# Patient Record
Sex: Male | Born: 1990 | Race: White | Hispanic: No | Smoking: Current every day smoker
Health system: Southern US, Community
[De-identification: ages and names within clinical notes are randomized; demographics above are authoritative.]

## PROBLEM LIST (undated history)

## (undated) DIAGNOSIS — F419 Anxiety disorder, unspecified: Secondary | ICD-10-CM

## (undated) DIAGNOSIS — F32A Depression, unspecified: Secondary | ICD-10-CM

## (undated) DIAGNOSIS — F329 Major depressive disorder, single episode, unspecified: Secondary | ICD-10-CM

## (undated) HISTORY — PX: WRIST SURGERY: SHX841

---
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
## 2010-09-27 ENCOUNTER — Emergency Department: Payer: Self-pay | Admitting: Emergency Medicine

## 2019-08-27 ENCOUNTER — Emergency Department
Admission: EM | Admit: 2019-08-27 | Discharge: 2019-08-27 | Disposition: A | Discharge status: Home / Self Care | Payer: BLUE CROSS/BLUE SHIELD | Attending: Emergency Medicine | Admitting: Emergency Medicine

## 2019-08-27 ENCOUNTER — Other Ambulatory Visit: Payer: Self-pay

## 2019-08-27 ENCOUNTER — Encounter: Payer: Self-pay | Admitting: Emergency Medicine

## 2019-08-27 DIAGNOSIS — Z79899 Other long term (current) drug therapy: Secondary | ICD-10-CM | POA: Insufficient documentation

## 2019-08-27 DIAGNOSIS — F1123 Opioid dependence with withdrawal: Secondary | ICD-10-CM | POA: Diagnosis not present

## 2019-08-27 DIAGNOSIS — R5383 Other fatigue: Secondary | ICD-10-CM | POA: Diagnosis present

## 2019-08-27 DIAGNOSIS — F17200 Nicotine dependence, unspecified, uncomplicated: Secondary | ICD-10-CM | POA: Insufficient documentation

## 2019-08-27 DIAGNOSIS — F1320 Sedative, hypnotic or anxiolytic dependence, uncomplicated: Secondary | ICD-10-CM | POA: Insufficient documentation

## 2019-08-27 HISTORY — DX: Anxiety disorder, unspecified: F41.9

## 2019-08-27 HISTORY — DX: Depression, unspecified: F32.A

## 2019-08-27 LAB — CBC WITH DIFFERENTIAL/PLATELET
Abs Immature Granulocytes: 0.14 10*3/uL — ABNORMAL HIGH (ref 0.00–0.07)
Basophils Absolute: 0 10*3/uL (ref 0.0–0.1)
Basophils Relative: 0 %
Eosinophils Absolute: 0 10*3/uL (ref 0.0–0.5)
Eosinophils Relative: 0 %
HCT: 43.6 % (ref 39.0–52.0)
Hemoglobin: 15 g/dL (ref 13.0–17.0)
Immature Granulocytes: 2 %
Lymphocytes Relative: 11 %
Lymphs Abs: 1 10*3/uL (ref 0.7–4.0)
MCH: 28.8 pg (ref 26.0–34.0)
MCHC: 34.4 g/dL (ref 30.0–36.0)
MCV: 83.7 fL (ref 80.0–100.0)
Monocytes Absolute: 0.4 10*3/uL (ref 0.1–1.0)
Monocytes Relative: 5 %
Neutro Abs: 7.2 10*3/uL (ref 1.7–7.7)
Neutrophils Relative %: 82 %
Platelets: 248 10*3/uL (ref 150–400)
RBC: 5.21 MIL/uL (ref 4.22–5.81)
RDW: 11.8 % (ref 11.5–15.5)
WBC: 8.8 10*3/uL (ref 4.0–10.5)
nRBC: 0 % (ref 0.0–0.2)

## 2019-08-27 LAB — URINE DRUG SCREEN, QUALITATIVE (ARMC ONLY)
Amphetamines, Ur Screen: NOT DETECTED
Barbiturates, Ur Screen: NOT DETECTED
Benzodiazepine, Ur Scrn: POSITIVE — AB
Cannabinoid 50 Ng, Ur ~~LOC~~: POSITIVE — AB
Cocaine Metabolite,Ur ~~LOC~~: NOT DETECTED
MDMA (Ecstasy)Ur Screen: NOT DETECTED
Methadone Scn, Ur: NOT DETECTED
Opiate, Ur Screen: NOT DETECTED
Phencyclidine (PCP) Ur S: NOT DETECTED
Tricyclic, Ur Screen: NOT DETECTED

## 2019-08-27 LAB — COMPREHENSIVE METABOLIC PANEL
ALT: 25 U/L (ref 0–44)
AST: 26 U/L (ref 15–41)
Albumin: 4.9 g/dL (ref 3.5–5.0)
Alkaline Phosphatase: 50 U/L (ref 38–126)
Anion gap: 14 (ref 5–15)
BUN: 9 mg/dL (ref 6–20)
CO2: 22 mmol/L (ref 22–32)
Calcium: 9.6 mg/dL (ref 8.9–10.3)
Chloride: 103 mmol/L (ref 98–111)
Creatinine, Ser: 0.75 mg/dL (ref 0.61–1.24)
GFR calc Af Amer: >60 mL/min (ref >60)
GFR calc non Af Amer: >60 mL/min (ref >60)
Glucose, Bld: 142 mg/dL — ABNORMAL HIGH (ref 70–99)
Potassium: 4.1 mmol/L (ref 3.5–5.1)
Sodium: 139 mmol/L (ref 135–145)
Total Bilirubin: 0.6 mg/dL (ref 0.3–1.2)
Total Protein: 7.9 g/dL (ref 6.5–8.1)

## 2019-08-27 LAB — BLOOD GAS, VENOUS
Acid-base deficit: 0.4 mmol/L (ref 0.0–2.0)
Bicarbonate: 25.4 mmol/L (ref 20.0–28.0)
O2 Saturation: 82.5 %
Patient temperature: 37
pCO2, Ven: 45 mmHg (ref 44.0–60.0)
pH, Ven: 7.36 (ref 7.250–7.430)
pO2, Ven: 49 mmHg — ABNORMAL HIGH (ref 32.0–45.0)

## 2019-08-27 LAB — ACETAMINOPHEN LEVEL: Acetaminophen (Tylenol), Serum: <10 ug/mL — ABNORMAL LOW (ref 10–30)

## 2019-08-27 LAB — ETHANOL: Alcohol, Ethyl (B): <10 mg/dL (ref <10)

## 2019-08-27 LAB — SALICYLATE LEVEL: Salicylate Lvl: <7 mg/dL (ref 2.8–30.0)

## 2019-08-27 MED ORDER — LORAZEPAM 2 MG PO TABS: 0.0000 mg | ORAL_TABLET | Freq: Two times a day (BID) | ORAL | Status: DC

## 2019-08-27 MED ORDER — ONDANSETRON 4 MG PO TBDP: 4.0000 mg | ORAL_TABLET | Freq: Three times a day (TID) | ORAL | 0 refills | Status: DC | PRN

## 2019-08-27 MED ORDER — ONDANSETRON HCL 4 MG PO TABS: 4.0000 mg | ORAL_TABLET | Freq: Three times a day (TID) | ORAL | Status: DC | PRN

## 2019-08-27 MED ORDER — CHLORDIAZEPOXIDE HCL 25 MG PO CAPS: ORAL_CAPSULE | ORAL | 0 refills | Status: DC

## 2019-08-27 MED ORDER — ALUM & MAG HYDROXIDE-SIMETH 200-200-20 MG/5ML PO SUSP: 30.0000 mL | Freq: Four times a day (QID) | ORAL | Status: DC | PRN

## 2019-08-27 MED ORDER — VITAMIN B-1 100 MG PO TABS
100.0000 mg | ORAL_TABLET | Freq: Every day | ORAL | Status: DC
  Administered 2019-08-27: 100 mg via ORAL
  Filled 2019-08-27: qty 1

## 2019-08-27 MED ORDER — CHLORDIAZEPOXIDE HCL 25 MG PO CAPS: ORAL_CAPSULE | ORAL | 0 refills | Status: AC

## 2019-08-27 MED ORDER — LORAZEPAM 2 MG/ML IJ SOLN: 0.0000 mg | Freq: Four times a day (QID) | INTRAMUSCULAR | Status: DC

## 2019-08-27 MED ORDER — LORAZEPAM 2 MG/ML IJ SOLN: 0.0000 mg | Freq: Two times a day (BID) | INTRAMUSCULAR | Status: DC

## 2019-08-27 MED ORDER — THIAMINE HCL 100 MG/ML IJ SOLN: 100.0000 mg | Freq: Every day | INTRAMUSCULAR | Status: DC

## 2019-08-27 MED ORDER — LORAZEPAM 2 MG/ML IJ SOLN: 1.0000 mg | Freq: Once | INTRAMUSCULAR | Status: DC

## 2019-08-27 MED ORDER — LORAZEPAM 2 MG PO TABS: 0.0000 mg | ORAL_TABLET | Freq: Four times a day (QID) | ORAL | Status: DC

## 2019-08-27 MED ORDER — LORAZEPAM 2 MG PO TABS
2.0000 mg | ORAL_TABLET | Freq: Once | ORAL | Status: AC
  Administered 2019-08-27: 21:00:00 2 mg via ORAL
  Filled 2019-08-27: qty 1

## 2019-08-27 NOTE — ED Notes (Signed)
Respiratory called and informed about blood being sent to lab.

## 2019-08-27 NOTE — ED Provider Notes (Signed)
[facility] Emergency Department Provider Note  ____________________________________________   First MD Initiated Contact with Patient 08/27/19 1823     (approximate)  I have reviewed the triage vital signs and the nursing notes.   HISTORY  Chief Complaint Fatigue, Anxiety, and Chest Pain    HPI Louis Pitts is a 28 y.o. male  Here seeking help for addiction, depression. Pt is on long-term suboxone and xanax prescriptions. States he takes 3 mg of Xanax TID.  He states that he recently moved to [place] from [place].  His pain physician is in [place].  He states that he has not been able to get back because he said multiple recent stressors including the recent death of his sister.  He is also had difficulty with transport.  He states that he has developed subsequent worsening depression.  He had been taking more Xanax than prescribed due to increasing anxiety feels like he can no longer function.  He also ran out of Suboxone.  He tried calling for help but his physician would not call it in, so he is here for help.  He states he is felt shaky, more depressed, anxious since running out of his medications.  He said chills.  He said chest pain which he states is also common for him.  Denies active SI but has had SI in the past. No HI. No AVH.       Past Medical History:  Diagnosis Date  . Anxiety   . Depression     There are no active problems to display for this patient.   History reviewed. No pertinent surgical history.  Prior to Admission medications   Medication Sig Start Date End Date Taking? Authorizing Provider  chlordiazePOXIDE (LIBRIUM) 25 MG capsule Take 2 capsules (50 mg total) by mouth 4 (four) times daily for 1 day, THEN 1 capsule (25 mg total) 4 (four) times daily for 1 day, THEN 1 capsule (25 mg total) 2 (two) times daily for 1 day, THEN 1 capsule (25 mg total) at bedtime for 1 day. 08/27/19 08/31/19  Shaune PollackIsaacs, Jackie Littlejohn, MD  ondansetron  (ZOFRAN ODT) 4 MG disintegrating tablet Take 1 tablet (4 mg total) by mouth every 8 (eight) hours as needed for nausea or vomiting. 08/27/19   Shaune PollackIsaacs, Rennae Ferraiolo, MD    Allergies Patient has no known allergies.  No family history on file.  Social History Social History   Tobacco Use  . Smoking status: Current Some Day Smoker  . Smokeless tobacco: Never Used  Substance Use Topics  . Alcohol use: Not Currently  . Drug use: Not Currently    Review of Systems  Review of Systems  Constitutional: Positive for chills and fatigue. Negative for fever.  HENT: Negative for sore throat.   Respiratory: Negative for shortness of breath.   Cardiovascular: Negative for chest pain.  Gastrointestinal: Positive for nausea. Negative for abdominal pain.  Genitourinary: Negative for flank pain.  Musculoskeletal: Negative for neck pain.  Skin: Negative for rash and wound.  Allergic/Immunologic: Negative for immunocompromised state.  Neurological: Positive for seizures and weakness. Negative for numbness.  Hematological: Does not bruise/bleed easily.  All other systems reviewed and are negative.    ____________________________________________  PHYSICAL EXAM:      VITAL SIGNS: ED Triage Vitals  Enc Vitals Group     BP      Pulse      Resp      Temp      Temp src  SpO2      Weight      Height      Head Circumference      Peak Flow      Pain Score      Pain Loc      Pain Edu?      Excl. in [place]?      Physical Exam Vitals signs and nursing note reviewed.  Constitutional:      General: He is not in acute distress.    Appearance: He is well-developed.  HENT:     Head: Normocephalic and atraumatic.  Eyes:     Conjunctiva/sclera: Conjunctivae normal.     Pupils: Pupils are equal, round, and reactive to light.     Comments: Dilated pupils bl  Neck:     Musculoskeletal: Neck supple.  Cardiovascular:     Rate and Rhythm: Normal rate and regular rhythm.     Heart sounds: Normal  heart sounds. No murmur. No friction rub.  Pulmonary:     Effort: Pulmonary effort is normal. No respiratory distress.     Breath sounds: Normal breath sounds. No wheezing or rales.  Abdominal:     General: There is no distension.     Palpations: Abdomen is soft.     Tenderness: There is no abdominal tenderness.  Skin:    General: Skin is warm.     Capillary Refill: Capillary refill takes less than 2 seconds.  Neurological:     Mental Status: He is alert and oriented to person, place, and time.     Motor: No abnormal muscle tone.      Neurological Exam:  Mental Status: Alert and oriented to person, place, and time. Attention and concentration normal. Speech clear. Recent memory is intact. Cranial Nerves: Visual fields grossly intact. EOMI and PERRLA. No nystagmus noted. Facial sensation intact at forehead, maxillary cheek, and chin/mandible bilaterally. No facial asymmetry or weakness. Hearing grossly normal. Uvula is midline, and palate elevates symmetrically. Normal SCM and trapezius strength. Tongue midline without fasciculations. Motor: Muscle strength 5/5 in proximal and distal UE and LE bilaterally. No pronator drift. Muscle tone normal. Reflexes: 2+ and symmetrical in all four extremities.  Sensation: Intact to light touch in upper and lower extremities distally bilaterally.  Gait: Normal without ataxia. Coordination: Normal FTN bilaterally.    ____________________________________________   LABS (all labs ordered are listed, but only abnormal results are displayed)  Labs Reviewed  CBC WITH DIFFERENTIAL/PLATELET - Abnormal; Notable for the following components:      Result Value   Abs Immature Granulocytes 0.14 (*)    All other components within normal limits  COMPREHENSIVE METABOLIC PANEL - Abnormal; Notable for the following components:   Glucose, Bld 142 (*)    All other components within normal limits  URINE DRUG SCREEN, QUALITATIVE ([facility] ONLY) - Abnormal; Notable  for the following components:   Cannabinoid 50 Ng, Ur [place] POSITIVE (*)    Benzodiazepine, Ur Scrn POSITIVE (*)    All other components within normal limits  BLOOD GAS, VENOUS - Abnormal; Notable for the following components:   pO2, Ven 49.0 (*)    All other components within normal limits  ACETAMINOPHEN LEVEL - Abnormal; Notable for the following components:   Acetaminophen (Tylenol), Serum <10 (*)    All other components within normal limits  ETHANOL  SALICYLATE LEVEL    ____________________________________________  EKG: Normal sinus rhythm, VR 96. PR 144, QRS 86, QTc 462. No ST-t segment elevation or depressions. No signs of  acute ischemia or infarct. No arrhythmia. ________________________________________  RADIOLOGY All imaging, including plain films, CT scans, and ultrasounds, independently reviewed by me, and interpretations confirmed via formal radiology reads.  ED MD interpretation:   None  Official radiology report(s): No results found.  ____________________________________________  PROCEDURES   Procedure(s) performed (including Critical Care):  Procedures  ____________________________________________  INITIAL IMPRESSION / MDM / ASSESSMENT AND PLAN / ED COURSE  As part of my medical decision making, I reviewed the following data within the electronic MEDICAL RECORD NUMBER Notes from prior ED visits and [place] Controlled Substance Database      *LAMEL HAYENGA was evaluated in Emergency Department on 08/28/2019 for the symptoms described in the history of present illness. He was evaluated in the context of the global COVID-19 pandemic, which necessitated consideration that the patient might be at risk for infection with the SARS-CoV-2 virus that causes COVID-19. Institutional protocols and algorithms that pertain to the evaluation of patients at risk for COVID-19 are in a state of rapid change based on information released by regulatory bodies including the CDC and  federal and state organizations. These policies and algorithms were followed during the patient's care in the ED.  Some ED evaluations and interventions may be delayed as a result of limited staffing during the pandemic.*      Medical Decision Making: 28 yo M with h/o anxiety, depression, here with shaking, sweatiness, anxiety, SOB in setting of running out of his Xanax and Suboxone. Pt has long h/o benzo use and opiate dependence. He is on extremely high dose of xanax 3mg  TID, in addition to suboxone. Reviewed his drug database - he does seem to be getting regular scripts from his PCP in [place] but is now in [place], where he is from. [facility]ed [facility] and [place] [facility]. He is calm, appropriate here. He does have reported possible seizure though this sounds more like tremors and hsi neuro exam is non-focal. This has all occurred to him when trying to stop benzos in the past. No signs of organic etiology.  Pt is goal oriented, denies any SI, HI, AVH, and has arranged fro outpt follow-up at [facility]. He is here with his family, who is at bedside and is going to watch pt at home until he checks in for inpt care.   While he does have high risk based on hsitory, feel the risks of untreated benzo w/d outweight risks of librium taper. I discussed that a brief course of librium could be rx'ed until he can go to [facility] in the next few days, but that it would need to remain with his parents for dosing as appropriate and prescribed. Family in agreement. They will monitor pt and distribute meds. He otherwise does not desire inpt detox at this time and does not meet inpt criteria. Short course of librium rx. Risks of accidental o/d discussed in detail. Has narcan @ home. He is out of his suboxone as well but declines tx for opiate w/d, states he has gone through w/d in past and will "be okay."  ____________________________________________  FINAL CLINICAL IMPRESSION(S) / ED DIAGNOSES  Final diagnoses:  Benzodiazepine  dependence (HCC)  Opioid dependence with withdrawal (HCC)     MEDICATIONS GIVEN DURING THIS VISIT:  Medications  LORazepam (ATIVAN) tablet 2 mg (2 mg Oral Given 08/27/19 2042)     ED Discharge Orders         Ordered    chlordiazePOXIDE (LIBRIUM) 25 MG capsule  Status:  Discontinued  08/27/19 2040    ondansetron (ZOFRAN ODT) 4 MG disintegrating tablet  Every 8 hours PRN,   Status:  Discontinued     08/27/19 2040    chlordiazePOXIDE (LIBRIUM) 25 MG capsule     08/27/19 2135    ondansetron (ZOFRAN ODT) 4 MG disintegrating tablet  Every 8 hours PRN     08/27/19 2135           Note:  This document was prepared using Dragon voice recognition software and may include unintentional dictation errors.   Shaune PollackIsaacs, Jestine Bicknell, MD 08/28/19 1025

## 2019-08-27 NOTE — ED Notes (Signed)
Care assumed at this time no report/handoff received

## 2019-08-27 NOTE — ED Notes (Addendum)
Pt with father at bedside poor historian with timeline of current and past problems and treatment nonconcurrent, reports suboxone use for the last 2 and intermittent abuse of benzodiazepines; pt's father reports pt is scheduled to go to treatment facility in [place] for detox  Pt reports wanting help for anxiety "this happens when I run out of my Xanax", pt reports taking my than prescribed d/t stress over "my sister just died", father reports EMS called because pt had a seizure "he was foaming at the mouth and twitching"  Pt has IV in right [facility]

## 2019-08-27 NOTE — ED Notes (Signed)
Peripheral IV discontinued. Catheter intact. No signs of infiltration or redness. Gauze applied to IV site.   

## 2019-08-27 NOTE — ED Notes (Signed)
This RN has gotten several calls for the pharmacy that pt prefered is closed now pharmacy changed and EDP sent to another pharmacy as of 2136

## 2019-08-27 NOTE — ED Triage Notes (Signed)
Pt to ED via [facility] for suspected xanax overdose. Per EMS they were called out because pt was more lethargic than normal, ems reports pt had some dried blood on his nose. Vital signs per EMS were as follows Heart rate 110, SpO2 98% on room air, Blood pressure 128/78.   Upon arrival to ED pt is alert and oriented. Pt states that he has hx/o anxiety and depression. Pt is prescribed Xanax 1 mg TID. Pt denies that he has taken more than his prescribed dose. Pt denies SI/HI at this time. Pt is calm and cooperative. Pt is currently reporting 3/10 non-radiating chest pain that he describes as "anxiety". Pt denies any shortness of breath at this time. Pt reports that his sister died from cancer approximately 8 months ago, pt states that he is having a difficult time dealing with this because his family does not really talk about it.   Pt is in no distress at this time.

## 2019-11-16 ENCOUNTER — Emergency Department
Admission: EM | Admit: 2019-11-16 | Discharge: 2019-11-16 | Disposition: A | Discharge status: Home / Self Care | Payer: BLUE CROSS/BLUE SHIELD | Attending: Emergency Medicine | Admitting: Emergency Medicine

## 2019-11-16 ENCOUNTER — Emergency Department: Payer: BLUE CROSS/BLUE SHIELD

## 2019-11-16 ENCOUNTER — Other Ambulatory Visit: Payer: Self-pay

## 2019-11-16 DIAGNOSIS — T50901A Poisoning by unspecified drugs, medicaments and biological substances, accidental (unintentional), initial encounter: Secondary | ICD-10-CM

## 2019-11-16 DIAGNOSIS — F17200 Nicotine dependence, unspecified, uncomplicated: Secondary | ICD-10-CM | POA: Diagnosis not present

## 2019-11-16 DIAGNOSIS — F1910 Other psychoactive substance abuse, uncomplicated: Secondary | ICD-10-CM | POA: Insufficient documentation

## 2019-11-16 LAB — CBC WITH DIFFERENTIAL/PLATELET
Abs Immature Granulocytes: 0.02 10*3/uL (ref 0.00–0.07)
Basophils Absolute: 0 10*3/uL (ref 0.0–0.1)
Basophils Relative: 0 %
Eosinophils Absolute: 0.1 10*3/uL (ref 0.0–0.5)
Eosinophils Relative: 3 %
HCT: 42.6 % (ref 39.0–52.0)
Hemoglobin: 14.1 g/dL (ref 13.0–17.0)
Immature Granulocytes: 0 %
Lymphocytes Relative: 27 %
Lymphs Abs: 1.2 10*3/uL (ref 0.7–4.0)
MCH: 28.8 pg (ref 26.0–34.0)
MCHC: 33.1 g/dL (ref 30.0–36.0)
MCV: 86.9 fL (ref 80.0–100.0)
Monocytes Absolute: 0.3 10*3/uL (ref 0.1–1.0)
Monocytes Relative: 8 %
Neutro Abs: 2.8 10*3/uL (ref 1.7–7.7)
Neutrophils Relative %: 62 %
Platelets: 210 10*3/uL (ref 150–400)
RBC: 4.9 MIL/uL (ref 4.22–5.81)
RDW: 12.7 % (ref 11.5–15.5)
WBC: 4.5 10*3/uL (ref 4.0–10.5)
nRBC: 0 % (ref 0.0–0.2)

## 2019-11-16 LAB — URINE DRUG SCREEN, QUALITATIVE (ARMC ONLY)
Amphetamines, Ur Screen: NOT DETECTED
Barbiturates, Ur Screen: NOT DETECTED
Benzodiazepine, Ur Scrn: POSITIVE — AB
Cannabinoid 50 Ng, Ur ~~LOC~~: POSITIVE — AB
Cocaine Metabolite,Ur ~~LOC~~: POSITIVE — AB
MDMA (Ecstasy)Ur Screen: NOT DETECTED
Methadone Scn, Ur: NOT DETECTED
Opiate, Ur Screen: POSITIVE — AB
Phencyclidine (PCP) Ur S: NOT DETECTED
Tricyclic, Ur Screen: NOT DETECTED

## 2019-11-16 LAB — COMPREHENSIVE METABOLIC PANEL
ALT: 15 U/L (ref 0–44)
AST: 16 U/L (ref 15–41)
Albumin: 4.4 g/dL (ref 3.5–5.0)
Alkaline Phosphatase: 47 U/L (ref 38–126)
Anion gap: 11 (ref 5–15)
BUN: 13 mg/dL (ref 6–20)
CO2: 22 mmol/L (ref 22–32)
Calcium: 8.8 mg/dL — ABNORMAL LOW (ref 8.9–10.3)
Chloride: 104 mmol/L (ref 98–111)
Creatinine, Ser: 0.72 mg/dL (ref 0.61–1.24)
GFR calc Af Amer: >60 mL/min (ref >60)
GFR calc non Af Amer: >60 mL/min (ref >60)
Glucose, Bld: 208 mg/dL — ABNORMAL HIGH (ref 70–99)
Potassium: 3.7 mmol/L (ref 3.5–5.1)
Sodium: 137 mmol/L (ref 135–145)
Total Bilirubin: 0.7 mg/dL (ref 0.3–1.2)
Total Protein: 7.3 g/dL (ref 6.5–8.1)

## 2019-11-16 LAB — TROPONIN I (HIGH SENSITIVITY): Troponin I (High Sensitivity): <2 ng/L (ref <18)

## 2019-11-16 LAB — ETHANOL: Alcohol, Ethyl (B): <10 mg/dL (ref <10)

## 2019-11-16 MED ORDER — SODIUM CHLORIDE 0.9 % IV SOLN
Freq: Once | INTRAVENOUS | Status: AC
  Administered 2019-11-16: 1000 mL via INTRAVENOUS
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
## 2019-11-16 NOTE — ED Notes (Signed)
Patient transported to CT 
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
## 2022-03-14 ENCOUNTER — Other Ambulatory Visit: Payer: Self-pay

## 2022-03-14 ENCOUNTER — Encounter: Payer: Self-pay | Admitting: Nurse Practitioner

## 2022-03-14 ENCOUNTER — Ambulatory Visit: Payer: BLUE CROSS/BLUE SHIELD | Admitting: Nurse Practitioner

## 2022-03-14 VITALS — BP 108/76 | HR 72 | Temp 97.4°F | Resp 12 | Ht 70.5 in | Wt 224.0 lb

## 2022-03-14 DIAGNOSIS — F5101 Primary insomnia: Secondary | ICD-10-CM

## 2022-03-14 DIAGNOSIS — Z6831 Body mass index (BMI) 31.0-31.9, adult: Secondary | ICD-10-CM

## 2022-03-14 DIAGNOSIS — F419 Anxiety disorder, unspecified: Secondary | ICD-10-CM | POA: Insufficient documentation

## 2022-03-14 DIAGNOSIS — Z0000 Encounter for general adult medical examination without abnormal findings: Secondary | ICD-10-CM | POA: Diagnosis not present

## 2022-03-14 DIAGNOSIS — R0683 Snoring: Secondary | ICD-10-CM

## 2022-03-14 DIAGNOSIS — Z720 Tobacco use: Secondary | ICD-10-CM

## 2022-03-14 DIAGNOSIS — E6609 Other obesity due to excess calories: Secondary | ICD-10-CM

## 2022-03-14 DIAGNOSIS — E66811 Obesity, class 1: Secondary | ICD-10-CM | POA: Insufficient documentation

## 2022-03-14 DIAGNOSIS — Q559 Congenital malformation of male genital organ, unspecified: Secondary | ICD-10-CM

## 2022-03-14 DIAGNOSIS — H6123 Impacted cerumen, bilateral: Secondary | ICD-10-CM

## 2022-03-14 LAB — COMPREHENSIVE METABOLIC PANEL
ALT: 27 U/L (ref 0–53)
AST: 16 U/L (ref 0–37)
Albumin: 4.8 g/dL (ref 3.5–5.2)
Alkaline Phosphatase: 43 U/L (ref 39–117)
BUN: 11 mg/dL (ref 6–23)
CO2: 31 mEq/L (ref 19–32)
Calcium: 9.2 mg/dL (ref 8.4–10.5)
Chloride: 105 mEq/L (ref 96–112)
Creatinine, Ser: 0.93 mg/dL (ref 0.40–1.50)
GFR: 109.11 mL/min (ref >60.00)
Glucose, Bld: 94 mg/dL (ref 70–99)
Potassium: 4.5 mEq/L (ref 3.5–5.1)
Sodium: 140 mEq/L (ref 135–145)
Total Bilirubin: 0.5 mg/dL (ref 0.2–1.2)
Total Protein: 6.7 g/dL (ref 6.0–8.3)

## 2022-03-14 LAB — CBC WITH DIFFERENTIAL/PLATELET
Basophils Absolute: 0 10*3/uL (ref 0.0–0.1)
Basophils Relative: 0.6 % (ref 0.0–3.0)
Eosinophils Absolute: 0.2 10*3/uL (ref 0.0–0.7)
Eosinophils Relative: 4 % (ref 0.0–5.0)
HCT: 45.8 % (ref 39.0–52.0)
Hemoglobin: 15.6 g/dL (ref 13.0–17.0)
Lymphocytes Relative: 35.5 % (ref 12.0–46.0)
Lymphs Abs: 2 10*3/uL (ref 0.7–4.0)
MCHC: 34 g/dL (ref 30.0–36.0)
MCV: 87.3 fl (ref 78.0–100.0)
Monocytes Absolute: 0.5 10*3/uL (ref 0.1–1.0)
Monocytes Relative: 9.6 % (ref 3.0–12.0)
Neutro Abs: 2.9 10*3/uL (ref 1.4–7.7)
Neutrophils Relative %: 50.3 % (ref 43.0–77.0)
Platelets: 216 10*3/uL (ref 150.0–400.0)
RBC: 5.25 Mil/uL (ref 4.22–5.81)
RDW: 13.1 % (ref 11.5–15.5)
WBC: 5.7 10*3/uL (ref 4.0–10.5)

## 2022-03-14 LAB — LIPID PANEL
Cholesterol: 189 mg/dL (ref 0–200)
HDL: 42.7 mg/dL (ref >39.00)
LDL Cholesterol: 129 mg/dL — ABNORMAL HIGH (ref 0–99)
NonHDL: 146.42
Total CHOL/HDL Ratio: 4
Triglycerides: 88 mg/dL (ref 0.0–149.0)
VLDL: 17.6 mg/dL (ref 0.0–40.0)

## 2022-03-14 LAB — HEMOGLOBIN A1C: Hgb A1c MFr Bld: 5.5 % (ref 4.6–6.5)

## 2022-03-14 LAB — TSH: TSH: 0.9 u[IU]/mL (ref 0.35–5.50)

## 2022-03-14 NOTE — Assessment & Plan Note (Signed)
Patient has been on trazodone 150 mg nightly nightly.  States he tolerates medication well seems to work well.  We will continue medication ?

## 2022-03-14 NOTE — Progress Notes (Signed)
? ?New Patient Office Visit ? ?Subjective:  ?Patient ID: Louis Pitts, male    DOB: 26-Jan-1991  Age: 31 y.o. MRN: [id number] ? ?CC:  ?Chief Complaint  ?Patient presents with  ? Establish Care  ?  Has went to [facility] in [place]  ? Snoring  ?  Has not been able to smell since he was 31 years of age. ? Deviated septum. Hx of drug use.  ? ? ?HPI ?Louis Parishanner S Kreutzer presents for complete physical and follow up of chronic conditions and to establish care with a new provider. ? ?Immunizations: ?-Tetanus: unsure ?-Influenza: refused ?-Covid-19: refused ?-Shingles: NA ?-Pneumonia: NA ? ?-HPV: ? ?Diet: Fair diet. Would have a protein shake, meal replacement bar and then protein with veggies. Water and coffee through the day ?Exercise: No regular exercise. ? ?Eye exam: PRN ?Dental exam: with in 6 months ? ? ?Colonoscopy: NA ?Dexa: NA ?PSA: NA ? ?Lung Cancer Screening: NA ? ?Has tried to quit smoking before but not successful. States that he has been thinking about it and it is coming, per his report  ? ?Sleep: bed around 1-2 am and getting up around 8am.  ?Feels rested. Snores to the point where he wakes himself up. Takes trazodone every night. Does report apneic episodes. ? 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 [facility] He still participates in group meetings. States that he never did inject drugs just snort them ? ?Past Medical History:  ?Diagnosis Date  ? Anxiety   ? Depression   ? ? ?Past Surgical History:  ?Procedure Laterality Date  ? WRIST SURGERY    ? RIGHT, screws put in into the wrist  ? ? ?Family History  ?Problem Relation Age of Onset  ? Depression Mother   ? Cancer Sister   ?     neuroblastoma  ? Diabetes Paternal Grandfather   ? ? ?Social History  ? ?Socioeconomic History  ? Marital status: Single  ?  Spouse name: Not on file  ? Number of children: Not on file  ? Years of education: Not on file  ? Highest education level: Not on file   ?Occupational History  ? Not on file  ?Tobacco Use  ? Smoking status: Every Day  ?  Packs/day: 0.25  ?  Years: 4.00  ?  Pack years: 1.00  ?  Types: Cigarettes  ? Smokeless tobacco: Current  ?  Types: Chew, Snuff  ?Substance and Sexual Activity  ? Alcohol use: Not Currently  ?  Comment: clean since 11/23/2019  ? Drug use: Not Currently  ?  Types: Cocaine, Heroin, Marijuana, Fentanyl, Codeine, Oxycodone  ?  Comment: clean since 11/23/2019  ? Sexual activity: Not on file  ?Other Topics Concern  ? Not on file  ?Social History Narrative  ? Fulltime: Credit card processing  ?   ? Hobbies: Writing, hiking, singing  ? ?Social Determinants of Health  ? ?Financial Resource Strain: Not on file  ?Food Insecurity: Not on file  ?Transportation Needs: Not on file  ?Physical Activity: Not on file  ?Stress: Not on file  ?Social Connections: Not on file  ?Intimate Partner Violence: Not on file  ? ? ?ROS ?Review of Systems  ?Constitutional:  Negative for chills, fatigue and fever.  ?Respiratory:  Negative for cough and shortness of breath.   ?Cardiovascular:  Negative for chest pain and leg swelling.  ?Gastrointestinal:  Negative for diarrhea, nausea and vomiting.  ?  BM daily ?  ?Genitourinary:  Negative for difficulty urinating, dysuria, hematuria, penile discharge, penile pain, penile swelling, scrotal swelling and testicular pain.  ?     Nocturia "-"  ?Neurological:  Negative for dizziness, weakness, light-headedness, numbness and headaches.  ?Psychiatric/Behavioral:  Negative for hallucinations and suicidal ideas.   ? ?Objective:  ? ?Today's Vitals: BP 108/76   Pulse 72   Temp (!) 97.4 ?F (36.3 ?C)   Resp 12   Ht 5' 10.5" (1.791 m)   Wt 224 lb (101.6 kg)   SpO2 96%   BMI 31.69 kg/m?  ? ?Physical Exam ?Vitals and nursing note reviewed. Exam conducted with a chaperone present Sanford Mayville Cromwell, RMA).  ?Constitutional:   ?   Appearance: Normal appearance.  ?HENT:  ?   Right Ear: Ear canal and external ear normal. There is  impacted cerumen.  ?   Left Ear: Ear canal and external ear normal. There is impacted cerumen.  ?   Mouth/Throat:  ?   Mouth: Mucous membranes are moist.  ?   Pharynx: Oropharynx is clear.  ?Eyes:  ?   Extraocular Movements: Extraocular movements intact.  ?   Pupils: Pupils are equal, round, and reactive to light.  ?Neck:  ?   Thyroid: No thyroid mass, thyromegaly or thyroid tenderness.  ?Cardiovascular:  ?   Rate and Rhythm: Normal rate and regular rhythm.  ?   Pulses: Normal pulses.  ?   Heart sounds: Normal heart sounds.  ?Pulmonary:  ?   Effort: Pulmonary effort is normal.  ?   Breath sounds: Normal breath sounds.  ?Abdominal:  ?   General: Bowel sounds are normal. There is no distension.  ?   Palpations: There is no mass.  ?   Tenderness: There is no abdominal tenderness.  ?   Hernia: No hernia is present. There is no hernia in the left inguinal area or right inguinal area.  ?Genitourinary: ?   Penis: Normal and circumcised.   ?   Testes: Normal.  ?   Epididymis:  ?   Right: Normal.  ?   Left: Normal.  ? ? ?   Comments: Maybe hydrocele and varicocele ?Musculoskeletal:  ?   Right lower leg: No edema.  ?   Left lower leg: No edema.  ?Lymphadenopathy:  ?   Cervical: No cervical adenopathy.  ?   Lower Body: No right inguinal adenopathy. No left inguinal adenopathy.  ?Skin: ?   General: Skin is warm.  ?Neurological:  ?   General: No focal deficit present.  ?   Mental Status: He is alert.  ?   Deep Tendon Reflexes:  ?   Reflex Scores: ?     Bicep reflexes are 2+ on the right side and 2+ on the left side. ?     Patellar reflexes are 2+ on the right side and 2+ on the left side. ?   Comments: Bilateral upper and lower extremity strength 5/5  ?Psychiatric:     ?   Mood and Affect: Mood normal.     ?   Behavior: Behavior normal.     ?   Thought Content: Thought content normal.     ?   Judgment: Judgment normal.  ? ? ?Assessment & Plan:  ? ?Problem List Items Addressed This Visit   ? ?  ? Nervous and Auditory  ? Bilateral  impacted cerumen  ?  Verbal consent obtained.  Per office policy mixture of water and hydroperoxide was repair and  bilateral ears irrigated.  Patient tolerated procedure well.  Impaction removed ?  ?  ? Relevant Orders  ? Ear Lavage  ?  ? Genitourinary  ? Scrotal anomaly  ?  This is a finding on exam.  On patient's left cords coming from testicle felt thicker and larger in comparison to the right side.  Did discuss with patient this could be benign in regards to the hydrocele/varicocele.  We will pursue US scrotum to find out ?  ?  ? Relevant Orders  ? US SCROTUM W/DOPPLER  ?  ? Other  ? Class 1 obesity due to excess calories without serious comorbidity with body mass index (BMI) of 31.0 to 31.9 in adult - Primary  ?  Courage healthy lifestyle modifications in regards to 30 minutes of exercise daily for 5 times a week. ?  ?  ? Relevant Orders  ? Hemoglobin A1c  ? Preventative health care  ?  Discussed age-appropriate immunizations and screening exams.  Encouraged healthy lifestyle modifications. ?  ?  ? Relevant Orders  ? CBC with Differential/Platelet  ? Comprehensive metabolic panel  ? Hemoglobin A1c  ? Lipid panel  ? TSH  ? HIV Antibody (routine testing w rflx)  ? Snoring  ?  Patient states he is snoring to the point that he wakes up states he does have a fianc? with him to sleep separate because of this and I did mention some apneic episodes.  Epworth Sleepiness Scale was less than 10.  Given patient's concern and description of problems we will send him to the [facility] pulmonology to rule out sleep apnea.  Amatory referral placed ?  ?  ? Relevant Orders  ? Ambulatory referral to Pulmonology  ? Ambulatory referral to ENT  ? Tobacco abuse  ?  Short timer smoker and smokeless tobacco user.  Did discuss needing to work towards smoking cessation.  Patient acknowledges and knows.  He has tried to quit in the past but without success. ?  ?  ? Primary insomnia  ?  Patient has been on trazodone 150 mg nightly nightly.   States he tolerates medication well seems to work well.  We will continue medication ?  ?  ? Anxiety  ?  Per patient report patient is on gabapentin 300 mg 3 times daily for anxiety.  States he has tried many [employer]

## 2022-03-14 NOTE — Assessment & Plan Note (Signed)
Courage healthy lifestyle modifications in regards to 30 minutes of exercise daily for 5 times a week. ?

## 2022-03-14 NOTE — Assessment & Plan Note (Signed)
This is a finding on exam.  On patient's left cords coming from testicle felt thicker and larger in comparison to the right side.  Did discuss with patient this could be benign in regards to the hydrocele/varicocele.  We will pursue US scrotum to find out ?

## 2022-03-14 NOTE — Assessment & Plan Note (Signed)
Short timer smoker and smokeless tobacco user.  Did discuss needing to work towards smoking cessation.  Patient acknowledges and knows.  He has tried to quit in the past but without success. ?

## 2022-03-14 NOTE — Assessment & Plan Note (Signed)
Per patient report patient is on gabapentin 300 mg 3 times daily for anxiety.  States he has tried many medications in the past without success we will continue medication as prescribed ?

## 2022-03-14 NOTE — Assessment & Plan Note (Signed)
Verbal consent obtained.  Per office policy mixture of water and hydroperoxide was repair and bilateral ears irrigated.  Patient tolerated procedure well.  Impaction removed ?

## 2022-03-14 NOTE — Assessment & Plan Note (Signed)
Patient states he is snoring to the point that he wakes up states he does have a fianc? with him to sleep separate because of this and I did mention some apneic episodes.  Epworth Sleepiness Scale was less than 10.  Given patient's concern and description of problems we will send him to the [facility] pulmonology to rule out sleep apnea.  Amatory referral placed ?

## 2022-03-14 NOTE — Patient Instructions (Signed)
Nice to see you today ?I will be in touch in regards to your labs once I have the result ?I want to see you in 1 year, sooner if you need me ? ?It is recommended to do 30 mins of exercise 5 times a week. You can start slow with 2 days a week and work your way up. ? ?

## 2022-03-14 NOTE — Assessment & Plan Note (Signed)
Discussed age-appropriate immunizations and screening exams.  Encouraged healthy lifestyle modifications ?

## 2022-03-15 LAB — HIV ANTIBODY (ROUTINE TESTING W REFLEX): HIV 1&2 Ab, 4th Generation: NONREACTIVE

## 2022-04-03 ENCOUNTER — Telehealth: Payer: Self-pay

## 2022-04-03 NOTE — Telephone Encounter (Signed)
Pt walked in and pt thought he had appt but could not find appt on schedule. I spoke with pt; pt said he wants breathing and lungs looked at; pt said for 2 years pt has been dipping at night and smoking during the day. Pt has smoked longer than dipped. Pt said last 4 - 5 months breathing has changed on and off and pt has ENT appt with [facility] on 05/15/22 at 2 pm with also being on their cancellation list; pt said the last time he dipped was last night and last time smoked was this morning. Pt has had a prod cough with white phlegm. SOB on and off with exertion and at rest. Cant say the last time had SOB. Pt said 2 nights ago he had coughing episode and had burning in chest and then entire body started tingling and this last 3 - 4 mins about 1:30 AM. Pt did not go anywhere to be seen at that time. Pt states he is in no distress with breathing at this time; no CP or dizziness. Vitals now T 98.1 P 76 BP 108/70 lt arm sitting with reg cuff. Pulse ox 96% room air. Pt scheduled appt with Red Christians FNP on 04/04/22 at 11:40 (first appt met pts work schedule); UC & ED precautions given and pt voiced understanding. Sending note to T Dugal FNP and Claiborne Billings CMA. ?

## 2022-04-04 ENCOUNTER — Ambulatory Visit (INDEPENDENT_AMBULATORY_CARE_PROVIDER_SITE_OTHER): Payer: Self-pay | Admitting: Family

## 2022-04-04 ENCOUNTER — Encounter: Payer: Self-pay | Admitting: Family

## 2022-04-04 VITALS — BP 116/62 | HR 76 | Temp 97.7°F | Resp 16 | Ht 70.5 in | Wt 226.2 lb

## 2022-04-04 DIAGNOSIS — R053 Chronic cough: Secondary | ICD-10-CM | POA: Insufficient documentation

## 2022-04-04 DIAGNOSIS — J301 Allergic rhinitis due to pollen: Secondary | ICD-10-CM

## 2022-04-04 DIAGNOSIS — R0602 Shortness of breath: Secondary | ICD-10-CM

## 2022-04-04 MED ORDER — MONTELUKAST SODIUM 10 MG PO TABS: 10.0000 mg | ORAL_TABLET | Freq: Every day | ORAL | 3 refills | Status: DC

## 2022-04-04 MED ORDER — PREDNISONE 20 MG PO TABS: ORAL_TABLET | ORAL | 0 refills | Status: DC

## 2022-04-04 MED ORDER — ALBUTEROL SULFATE HFA 108 (90 BASE) MCG/ACT IN AERS: 2.0000 | INHALATION_SPRAY | Freq: Four times a day (QID) | RESPIRATORY_TRACT | 0 refills | Status: DC | PRN

## 2022-04-04 NOTE — Patient Instructions (Addendum)
Recommend Flonase daily to start as well as consider nightly zyrtec.  ?I am sending you a prescription for prednisone that you will take for your shortness of breath.  ?I am also prescribing you Singulair 10 mg that you will take once nightly.  ? I would suggest that you take Singulair after week one, so that you do not start multiple medications at once.  ? ?I did send an inhaler, this is only as needed. This is when your chest feels really tight and you have shortness of breath.  ? ? ?Due to recent changes in healthcare laws, you may see results of your imaging and/or laboratory studies on MyChart before I have had a chance to review them.  I understand that in some cases there may be results that are confusing or concerning to you. Please understand that not all results are received at the same time and often I may need to interpret multiple results in order to provide you with the best plan of care or course of treatment. Therefore, I ask that you please give me 2 business days to thoroughly review all your results before contacting my office for clarification. Should we see a critical lab result, you will be contacted sooner.  ? ?It was a pleasure seeing you today! Please do not hesitate to reach out with any questions and or concerns. ? ?Regards,  ? ?Jarquez Mestre ?FNP-C ? ?

## 2022-04-04 NOTE — Assessment & Plan Note (Signed)
rx albuterol 108 mcg hfa as needed ?rx prednisone 20 mg taper ? ?

## 2022-04-04 NOTE — Progress Notes (Signed)
? ?Established Patient Office Visit ? ?Subjective:  ?Patient ID: Louis Pitts, male    DOB: 07/28/1991  Age: 31 y.o. MRN: [id number] ? ?CC:  ?Chief Complaint  ?Patient presents with  ? Nasal Congestion  ? Shortness of Breath  ?  X 2 months   ? ? ?HPI ?Louis Pitts is here today with concerns.  ? ?States feeling a tickle in his throat/ that makes him want to cough.  ?Does have h/o allergies.  ?He does feel some chest tightness on and off with some sob/hard to take a deep breath at times. Does feel sob when he tries to sing or so. Not necessary when walking around or anything.  ? ?No ear pain. No sore throat. No sinus pressure or tenderness.  ?Slight nasal congestion, for the last few months.  ? ?For allergies, he takes saline spray but not for long, for allergy medication taking something otc allergy medication.  ? ?No known h/o asthma.  ? ?Has not had a real sense of smell since age 39. He does have appt with ENT in May for this. Never had a complete workup for this.  ? ?Past Medical History:  ?Diagnosis Date  ? Anxiety   ? Depression   ? ? ?Past Surgical History:  ?Procedure Laterality Date  ? WRIST SURGERY    ? RIGHT, screws put in into the wrist  ? ? ?Family History  ?Problem Relation Age of Onset  ? Depression Mother   ? Cancer Sister   ?     neuroblastoma  ? Diabetes Paternal Grandfather   ? ? ?Social History  ? ?Socioeconomic History  ? Marital status: Single  ?  Spouse name: Not on file  ? Number of children: Not on file  ? Years of education: Not on file  ? Highest education level: Not on file  ?Occupational History  ? Not on file  ?Tobacco Use  ? Smoking status: Every Day  ?  Packs/day: 0.25  ?  Years: 4.00  ?  Pack years: 1.00  ?  Types: Cigarettes  ? Smokeless tobacco: Current  ?  Types: Chew, Snuff  ?Substance and Sexual Activity  ? Alcohol use: Not Currently  ?  Comment: clean since 11/23/2019  ? Drug use: Not Currently  ?  Types: Cocaine, Heroin, Marijuana, Fentanyl, Codeine, Oxycodone  ?   Comment: clean since 11/23/2019  ? Sexual activity: Not on file  ?Other Topics Concern  ? Not on file  ?Social History Narrative  ? Fulltime: Credit card processing  ?   ? Hobbies: Writing, hiking, singing  ? ?Social Determinants of Health  ? ?Financial Resource Strain: Not on file  ?Food Insecurity: Not on file  ?Transportation Needs: Not on file  ?Physical Activity: Not on file  ?Stress: Not on file  ?Social Connections: Not on file  ?Intimate Partner Violence: Not on file  ? ? ?Outpatient Medications Prior to Visit  ?Medication Sig Dispense Refill  ? gabapentin (NEURONTIN) 300 MG capsule Take 1 capsule by mouth 3 (three) times daily.    ? traZODone (DESYREL) 150 MG tablet Take 150 mg by mouth at bedtime.    ? ?No facility-administered medications prior to visit.  ? ? ?Allergies  ?Allergen Reactions  ? Other Anaphylaxis  ?  GAIN DETERGENT/SOAP   ? ? ?ROS ?Review of Systems  ?Constitutional:  Positive for fatigue. Negative for chills and fever.  ?HENT:  Positive for congestion, postnasal drip and sore throat. Negative for ear discharge, ear  pain and sinus pain.   ?Eyes:  Negative for discharge and itching.  ?Respiratory:  Positive for cough (dry non productive cough), chest tightness (at times throughout the day) and shortness of breath (here and there mainly with talking alot and singing). Negative for wheezing.   ?Cardiovascular:  Negative for chest pain and palpitations.  ? ?  ?Objective:  ?  ?Physical Exam ?Constitutional:   ?   General: He is awake. He is not in acute distress. ?   Appearance: Normal appearance. He is not ill-appearing.  ?HENT:  ?   Right Ear: A middle ear effusion is present. Impacted cerumen: clear.  ?   Left Ear: Tympanic membrane normal.  ?   Nose: Mucosal edema, congestion and rhinorrhea present.  ?   Right Turbinates: Enlarged (ight >left) and swollen.  ?   Left Turbinates: Enlarged and swollen.  ?   Right Sinus: No maxillary sinus tenderness or frontal sinus tenderness.  ?   Left Sinus:  No maxillary sinus tenderness or frontal sinus tenderness.  ?   Mouth/Throat:  ?   Mouth: Mucous membranes are moist.  ?   Pharynx: No pharyngeal swelling, oropharyngeal exudate or posterior oropharyngeal erythema.  ?Eyes:  ?   Extraocular Movements: Extraocular movements intact.  ?   Pupils: Pupils are equal, round, and reactive to light.  ?Cardiovascular:  ?   Rate and Rhythm: Normal rate and regular rhythm.  ?Pulmonary:  ?   Effort: Pulmonary effort is normal.  ?   Breath sounds: Normal breath sounds. No wheezing.  ?Neurological:  ?   Mental Status: He is alert.  ? ? ?BP 116/62   Pulse 76   Temp 97.7 ?F (36.5 ?C)   Resp 16   Ht 5' 10.5" (1.791 m)   Wt 226 lb 4 oz (102.6 kg)   SpO2 97%   BMI 32.00 kg/m?  ?Wt Readings from Last 3 Encounters:  ?04/04/22 226 lb 4 oz (102.6 kg)  ?03/14/22 224 lb (101.6 kg)  ?11/16/19 210 lb (95.3 kg)  ? ? ? ?Health Maintenance Due  ?Topic Date Due  ? Hepatitis C Screening  Never done  ? TETANUS/TDAP  Never done  ? ? ?There are no preventive care reminders to display for this patient. ? ?Lab Results  ?Component Value Date  ? TSH 0.90 03/14/2022  ? ?Lab Results  ?Component Value Date  ? WBC 5.7 03/14/2022  ? HGB 15.6 03/14/2022  ? HCT 45.8 03/14/2022  ? MCV 87.3 03/14/2022  ? PLT 216.0 03/14/2022  ? ?Lab Results  ?Component Value Date  ? NA 140 03/14/2022  ? K 4.5 03/14/2022  ? CO2 31 03/14/2022  ? GLUCOSE 94 03/14/2022  ? BUN 11 03/14/2022  ? CREATININE 0.93 03/14/2022  ? BILITOT 0.5 03/14/2022  ? ALKPHOS 43 03/14/2022  ? AST 16 03/14/2022  ? ALT 27 03/14/2022  ? PROT 6.7 03/14/2022  ? ALBUMIN 4.8 03/14/2022  ? CALCIUM 9.2 03/14/2022  ? ANIONGAP 11 11/16/2019  ? GFR 109.11 03/14/2022  ? ?Lab Results  ?Component Value Date  ? HGBA1C 5.5 03/14/2022  ? ? ?  ?Assessment & Plan:  ? ?Problem List Items Addressed This Visit   ? ?  ? Respiratory  ? Seasonal allergic rhinitis due to pollen  ?  Start flonase 50 mcg otc as well as zyrtec 10 mg otc ?rx for singulair 10 mg nightly  ? ?  ?  ?  Relevant Medications  ? montelukast (SINGULAIR) 10 MG tablet  ?  ?  Other  ? Chronic cough - Primary  ?  Suspected allergic/reactive airway disease ?Treating today ?  ?  ? Relevant Medications  ? montelukast (SINGULAIR) 10 MG tablet  ? Shortness of breath  ?  rx albuterol 108 mcg hfa as needed ?rx prednisone 20 mg taper ? ?  ?  ? Relevant Medications  ? albuterol (VENTOLIN HFA) 108 (90 Base) MCG/ACT inhaler  ? predniSONE (DELTASONE) 20 MG tablet  ? ? ?Meds ordered this encounter  ?Medications  ? albuterol (VENTOLIN HFA) 108 (90 Base) MCG/ACT inhaler  ?  Sig: Inhale 2 puffs into the lungs every 6 (six) hours as needed for wheezing or shortness of breath.  ?  Dispense:  8 g  ?  Refill:  0  ?  Order Specific Question:   Supervising Provider  ?  Answer:   BEDSOLE, AMY E [[id number]]  ? montelukast (SINGULAIR) 10 MG tablet  ?  Sig: Take 1 tablet (10 mg total) by mouth at bedtime.  ?  Dispense:  30 tablet  ?  Refill:  3  ?  Order Specific Question:   Supervising Provider  ?  Answer:   BEDSOLE, AMY E [[id number]]  ? predniSONE (DELTASONE) 20 MG tablet  ?  Sig: Take two tablets daily for 3 days followed by one tablet daily for 4 days  ?  Dispense:  10 tablet  ?  Refill:  0  ?  Order Specific Question:   Supervising Provider  ?  Answer:   BEDSOLE, AMY E [[id number]]  ? ? ?Follow-up: No follow-ups on file.  ? ? ?Mort Sawyersabitha Shmiel Morton, FNP ?

## 2022-04-04 NOTE — Assessment & Plan Note (Signed)
Start flonase 50 mcg otc as well as zyrtec 10 mg otc ?rx for singulair 10 mg nightly  ? ?

## 2022-04-04 NOTE — Assessment & Plan Note (Signed)
Suspected allergic/reactive airway disease ?Treating today ?

## 2022-04-05 NOTE — Telephone Encounter (Signed)
Seen pt yesterday thank you. ?

## 2022-04-15 ENCOUNTER — Telehealth: Payer: Self-pay | Admitting: Nurse Practitioner

## 2022-04-15 ENCOUNTER — Other Ambulatory Visit: Payer: Self-pay | Admitting: Nurse Practitioner

## 2022-04-15 DIAGNOSIS — F419 Anxiety disorder, unspecified: Secondary | ICD-10-CM

## 2022-04-15 MED ORDER — GABAPENTIN 300 MG PO CAPS: 300.0000 mg | ORAL_CAPSULE | Freq: Three times a day (TID) | ORAL | 3 refills | Status: DC

## 2022-04-15 NOTE — Telephone Encounter (Signed)
Medication sent to pharmacy on file.  

## 2022-04-15 NOTE — Telephone Encounter (Signed)
?  Encourage patient to contact the pharmacy for refills or they can request refills through [facility] ? ?Did the patient contact the pharmacy:  Y ? ? ?LAST APPOINTMENT DATE:  3.23.23 ? ?NEXT APPOINTMENT DATE: 5.24.23 ? ?MEDICATION:  gabapentin (NEURONTIN) 300 MG capsule ? ?Is the patient out of medication?  N ? ?If not, how much is left? (About a week?) ? ?Is this a 90 day supply: N ? ?PHARMACY:  ?[id number] - [place], [place] - [phone] Phone:  [phone]  ?Fax:  [phone]  ?  ? ? ?Let patient know to contact pharmacy at the end of the day to make sure medication is ready. ? ?Please notify patient to allow 48-72 hours to process ?  ?

## 2022-04-24 ENCOUNTER — Institutional Professional Consult (permissible substitution): Payer: Self-pay | Admitting: Primary Care

## 2022-05-15 ENCOUNTER — Encounter: Payer: Self-pay | Admitting: Nurse Practitioner

## 2022-07-02 ENCOUNTER — Other Ambulatory Visit: Payer: Self-pay | Admitting: Nurse Practitioner

## 2022-07-02 DIAGNOSIS — F419 Anxiety disorder, unspecified: Secondary | ICD-10-CM

## 2022-10-01 ENCOUNTER — Other Ambulatory Visit: Payer: Self-pay | Admitting: Nurse Practitioner

## 2022-10-01 NOTE — Telephone Encounter (Signed)
  Encourage patient to contact the pharmacy for refills or they can request refills through [facility]  Did the patient contact the pharmacy: yes   LAST APPOINTMENT DATE: 03/14/22  NEXT APPOINTMENT DATE:  MEDICATION:traZODone (DESYREL) 150 MG tablet  Is the patient out of medication? yes  If not, how much is left?  Is this a 90 day supply:   PHARMACY: [id number] [place], [place] Phone:  [phone]  Fax:  [phone]      Let patient know to contact pharmacy at the end of the day to make sure medication is ready.  Please notify patient to allow 48-72 hours to process

## 2022-10-02 MED ORDER — TRAZODONE HCL 150 MG PO TABS: 150.0000 mg | ORAL_TABLET | Freq: Every day | ORAL | 1 refills | Status: DC

## 2022-10-04 ENCOUNTER — Telehealth: Payer: Self-pay | Admitting: Nurse Practitioner

## 2022-10-04 ENCOUNTER — Other Ambulatory Visit: Payer: Self-pay

## 2022-10-04 DIAGNOSIS — F419 Anxiety disorder, unspecified: Secondary | ICD-10-CM

## 2022-10-04 MED ORDER — GABAPENTIN 300 MG PO CAPS: ORAL_CAPSULE | ORAL | 3 refills | Status: DC

## 2022-10-04 NOTE — Telephone Encounter (Signed)
Patient advised that medication was refilled.

## 2022-10-04 NOTE — Telephone Encounter (Signed)
Patient came by the office stating Gabapentin needs to be refilled. [facility] and confirmed that there were no more refills on file.

## 2022-10-04 NOTE — Telephone Encounter (Signed)
Error

## 2022-11-13 ENCOUNTER — Other Ambulatory Visit: Payer: Self-pay | Admitting: Nurse Practitioner

## 2022-12-16 ENCOUNTER — Other Ambulatory Visit: Payer: Self-pay | Admitting: Nurse Practitioner

## 2023-01-09 ENCOUNTER — Other Ambulatory Visit: Payer: Self-pay | Admitting: Nurse Practitioner

## 2023-01-09 DIAGNOSIS — F419 Anxiety disorder, unspecified: Secondary | ICD-10-CM

## 2023-01-09 NOTE — Telephone Encounter (Signed)
Per chart Gabapentin was sent 10/04/22, #90, 3 refills, which should last him for one year.  If he is wanting to pick up a refill early he may have to pay out of pocket, as insurance will generally not cover an early fill.  [place]

## 2023-01-09 NOTE — Telephone Encounter (Signed)
Patient stopped by stating that he is going out of town,and would like to know if this rx could possibly be filled earlier?

## 2023-01-10 NOTE — Telephone Encounter (Signed)
Correction: Patient takes TID so this rx is only for 4 months.  Spoke with patient and he is aware he may have to pay out of pocket if refill is too early. Recommended [facility] for discount. Patient voiced understanding.  Please review for refill. Thanks!

## 2023-01-22 ENCOUNTER — Telehealth: Payer: Self-pay | Admitting: Nurse Practitioner

## 2023-01-22 NOTE — Telephone Encounter (Signed)
Got notice that the patient is on buspar. Is he seeing a behavioral health clinician that started this medication? Anderson Malta Chatmon? Not sure if she is a Therapist, music or [facility] provider

## 2023-01-23 NOTE — Telephone Encounter (Signed)
Spoke with patient and he states he has been going to [facility]. He does currently take Buspar 10mg  TID, the name on the bottle is Audria Nine, NP, however he does not recall seeing her specifically.  Chart updated.  FYI - thanks!

## 2023-01-23 NOTE — Addendum Note (Signed)
Addended by: Wadie Lessen on: 01/23/2023 09:38 AM   Modules accepted: Orders

## 2023-02-23 ENCOUNTER — Other Ambulatory Visit: Payer: Self-pay | Admitting: Nurse Practitioner

## 2023-03-18 ENCOUNTER — Encounter: Payer: Self-pay | Admitting: Nurse Practitioner

## 2023-04-09 ENCOUNTER — Ambulatory Visit: Payer: Self-pay | Admitting: Family Medicine

## 2023-04-09 ENCOUNTER — Encounter: Payer: Self-pay | Admitting: Nurse Practitioner

## 2023-04-11 ENCOUNTER — Other Ambulatory Visit: Payer: Self-pay | Admitting: Nurse Practitioner

## 2023-04-11 DIAGNOSIS — F419 Anxiety disorder, unspecified: Secondary | ICD-10-CM

## 2023-04-11 NOTE — Telephone Encounter (Signed)
Prescription Request  04/11/2023  LOV: Visit date not found  What is the name of the medication or equipment? gabapentin (NEURONTIN) 300 MG capsule, he will be out today and needs them   Have you contacted your pharmacy to request a refill? Yes   Which pharmacy would you like this sent to?  [id number],  - [address] [place] [address] Phone: [phone] Fax: [phone]    Patient notified that their request is being sent to the clinical staff for review and that they should receive a response within 2 business days.   Please advise at Mobile [phone] (mobile)

## 2023-04-11 NOTE — Telephone Encounter (Signed)
Refill for gabapentin (NEURONTIN) 300 MG capsule   LV- 03/14/22; recently no-showed appt LR- 01/10/23 ( 90 caps/ 3 refills) NV- 05/16/23

## 2023-04-14 MED ORDER — GABAPENTIN 300 MG PO CAPS: ORAL_CAPSULE | ORAL | 0 refills | Status: DC

## 2023-04-14 NOTE — Telephone Encounter (Signed)
Patient came by to follow up on refill request. Informed him that Susy Frizzle wants to see him earlier. Patient stated that he isn't available until May 24 due to work. Thank you!

## 2023-05-15 ENCOUNTER — Encounter: Payer: Self-pay | Admitting: Nurse Practitioner

## 2023-05-15 ENCOUNTER — Ambulatory Visit (INDEPENDENT_AMBULATORY_CARE_PROVIDER_SITE_OTHER): Payer: 59 | Admitting: Nurse Practitioner

## 2023-05-15 VITALS — BP 100/80 | HR 76 | Temp 97.8°F | Resp 16 | Ht 71.0 in | Wt 227.1 lb

## 2023-05-15 DIAGNOSIS — E669 Obesity, unspecified: Secondary | ICD-10-CM | POA: Diagnosis not present

## 2023-05-15 DIAGNOSIS — Z720 Tobacco use: Secondary | ICD-10-CM | POA: Diagnosis not present

## 2023-05-15 DIAGNOSIS — Z0000 Encounter for general adult medical examination without abnormal findings: Secondary | ICD-10-CM

## 2023-05-15 DIAGNOSIS — E7800 Pure hypercholesterolemia, unspecified: Secondary | ICD-10-CM | POA: Insufficient documentation

## 2023-05-15 DIAGNOSIS — F5101 Primary insomnia: Secondary | ICD-10-CM | POA: Diagnosis not present

## 2023-05-15 DIAGNOSIS — Q5520 Unspecified congenital malformations of testis and scrotum: Secondary | ICD-10-CM | POA: Diagnosis not present

## 2023-05-15 DIAGNOSIS — F419 Anxiety disorder, unspecified: Secondary | ICD-10-CM | POA: Diagnosis not present

## 2023-05-15 LAB — COMPREHENSIVE METABOLIC PANEL
ALT: 24 U/L (ref 0–53)
AST: 16 U/L (ref 0–37)
Albumin: 4.7 g/dL (ref 3.5–5.2)
Alkaline Phosphatase: 37 U/L — ABNORMAL LOW (ref 39–117)
BUN: 12 mg/dL (ref 6–23)
CO2: 30 mEq/L (ref 19–32)
Calcium: 9.8 mg/dL (ref 8.4–10.5)
Chloride: 102 mEq/L (ref 96–112)
Creatinine, Ser: 0.8 mg/dL (ref 0.40–1.50)
GFR: 116.64 mL/min (ref >60.00)
Glucose, Bld: 89 mg/dL (ref 70–99)
Potassium: 4.4 mEq/L (ref 3.5–5.1)
Sodium: 139 mEq/L (ref 135–145)
Total Bilirubin: 0.7 mg/dL (ref 0.2–1.2)
Total Protein: 6.9 g/dL (ref 6.0–8.3)

## 2023-05-15 LAB — HEMOGLOBIN A1C: Hgb A1c MFr Bld: 5.4 % (ref 4.6–6.5)

## 2023-05-15 LAB — CBC
HCT: 46.7 % (ref 39.0–52.0)
Hemoglobin: 15.7 g/dL (ref 13.0–17.0)
MCHC: 33.6 g/dL (ref 30.0–36.0)
MCV: 87.8 fl (ref 78.0–100.0)
Platelets: 215 10*3/uL (ref 150.0–400.0)
RBC: 5.32 Mil/uL (ref 4.22–5.81)
RDW: 12.4 % (ref 11.5–15.5)
WBC: 5.2 10*3/uL (ref 4.0–10.5)

## 2023-05-15 LAB — TSH: TSH: 0.89 u[IU]/mL (ref 0.35–5.50)

## 2023-05-15 LAB — LIPID PANEL
Cholesterol: 181 mg/dL (ref 0–200)
HDL: 42.3 mg/dL (ref >39.00)
LDL Cholesterol: 114 mg/dL — ABNORMAL HIGH (ref 0–99)
NonHDL: 138.28
Total CHOL/HDL Ratio: 4
Triglycerides: 120 mg/dL (ref 0.0–149.0)
VLDL: 24 mg/dL (ref 0.0–40.0)

## 2023-05-15 LAB — URINALYSIS, MICROSCOPIC ONLY
RBC / HPF: NONE SEEN (ref 0–?)
WBC, UA: NONE SEEN (ref 0–?)

## 2023-05-15 MED ORDER — GABAPENTIN 300 MG PO CAPS: ORAL_CAPSULE | ORAL | 3 refills | Status: DC

## 2023-05-15 MED ORDER — BUSPIRONE HCL 10 MG PO TABS: 10.0000 mg | ORAL_TABLET | Freq: Three times a day (TID) | ORAL | 3 refills | Status: DC

## 2023-05-15 NOTE — Assessment & Plan Note (Signed)
History of the same pending lipids weight has been stable over the past year

## 2023-05-15 NOTE — Assessment & Plan Note (Signed)
Will check a urine microscopy to rule out microscopic hematuria secondary to tobacco abuse.

## 2023-05-15 NOTE — Assessment & Plan Note (Signed)
Discussed age-appropriate immunizations and screening exams.  Did review patient's personal, surgical, social, family histories.  Patient refused tetanus vaccine.  Too young for CRC screening or prostate cancer screening.  Patient was given information at discharge about preventative healthcare maintenance with anticipatory guidance.  Patient states he had no concern for STI and deferred testing today.

## 2023-05-15 NOTE — Progress Notes (Signed)
Established Patient Office Visit  Subjective   Patient ID: Louis Pitts, male    DOB: 1991/10/03  Age: 32 y.o. MRN: [id number]  Chief Complaint  Patient presents with   Annual Exam    HPI  for complete physical and follow up of chronic conditions.  Insomnia: state that he is no longe ron trazodone. States that he has been off it for approx 1 month  Seasonal allergies:  Anxiety: states that he is on gabapentin and buspar  Immunizations: -Tetanus: needs updating patient defers -Influenza: Out of season -Shingles: Too young -Pneumonia: Too young  Diet: Fair diet. States that he is doing approx 1 large meal that is take out or home-cooked and then a snack later in the day.  Exercise: No regular exercise.  Eye exam: Prn Dental exam: Needs updating   Colonoscopy: Too young, currently average risk Lung Cancer Screening: Too young  PSA: Too young, currently average risk  Sleep: states that at night he wants to have control and tries to stay up later. States that he will go to bed around 11 and get ups around 7. Feels rested. Does snore     Review of Systems  Constitutional:  Negative for chills and fever.  Respiratory:  Negative for shortness of breath.   Cardiovascular:  Negative for chest pain and leg swelling.  Gastrointestinal:  Negative for abdominal pain, blood in stool, constipation, diarrhea, nausea and vomiting.       BM daily   Genitourinary:  Negative for dysuria and hematuria.  Neurological:  Negative for tingling and headaches.  Psychiatric/Behavioral:  Negative for hallucinations and suicidal ideas.       Objective:     BP 100/80   Pulse 76   Temp 97.8 F (36.6 C)   Resp 16   Ht 5\' 11"  (1.803 m)   Wt 227 lb 2 oz (103 kg)   SpO2 98%   BMI 31.68 kg/m    Wt Readings from Last 3 Encounters:  05/15/23 227 lb 2 oz (103 kg)  04/04/22 226 lb 4 oz (102.6 kg)  03/14/22 224 lb (101.6 kg)     Physical Exam Vitals and nursing note reviewed.  Exam conducted with a chaperone present Tresa Endo Fiscal, CMA).  Constitutional:      Appearance: Normal appearance.  HENT:     Right Ear: Tympanic membrane, ear canal and external ear normal.     Left Ear: Tympanic membrane, ear canal and external ear normal.     Mouth/Throat:     Mouth: Mucous membranes are moist.     Pharynx: Oropharynx is clear.  Eyes:     Extraocular Movements: Extraocular movements intact.     Pupils: Pupils are equal, round, and reactive to light.  Cardiovascular:     Rate and Rhythm: Normal rate and regular rhythm.     Pulses: Normal pulses.     Heart sounds: Normal heart sounds.  Pulmonary:     Effort: Pulmonary effort is normal.     Breath sounds: Normal breath sounds.  Abdominal:     General: Bowel sounds are normal. There is no distension.     Palpations: There is no mass.     Tenderness: There is no abdominal tenderness.     Hernia: No hernia is present. There is no hernia in the left inguinal area or right inguinal area.  Genitourinary:    Penis: Normal.      Testes: Normal.     Epididymis:  Right: Normal.     Left: Normal.    Musculoskeletal:     Right lower leg: No edema.     Left lower leg: No edema.  Lymphadenopathy:     Cervical: No cervical adenopathy.     Lower Body: No right inguinal adenopathy. No left inguinal adenopathy.  Skin:    General: Skin is warm.  Neurological:     General: No focal deficit present.     Mental Status: He is alert.     Deep Tendon Reflexes:     Reflex Scores:      Bicep reflexes are 2+ on the right side and 2+ on the left side.      Patellar reflexes are 2+ on the right side and 2+ on the left side.    Comments: Bilateral upper and lower extremity strength 5/5  Psychiatric:        Mood and Affect: Mood normal.        Behavior: Behavior normal.        Thought Content: Thought content normal.        Judgment: Judgment normal.     No results found for any visits on 05/15/23.    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        Other   Preventative health care    Discussed age-appropriate immunizations and screening exams.  Did review patient's personal, surgical, social, family histories.  Patient refused tetanus vaccine.  Too young for CRC screening or prostate cancer screening.  Patient was given information at discharge about preventative healthcare maintenance with anticipatory guidance.  Patient states he had no concern for STI and deferred testing today.      Relevant Orders   CBC   Comprehensive metabolic panel   TSH   Tobacco abuse    Will check a urine microscopy to rule out microscopic hematuria secondary to tobacco abuse.      Primary insomnia    Was on trazodone but has been off for approximately 1 to 2 months doing well without medication continue without trazodone at this juncture      Anxiety   Relevant Medications   busPIRone (BUSPAR) 10 MG tablet   gabapentin (NEURONTIN) 300 MG capsule   Pure hypercholesterolemia - Primary    History of the same pending lipids weight has been stable over the past year      Relevant Orders   Hemoglobin A1c   Lipid panel   Obesity (BMI 30-39.9)    Continue working on healthy lifestyle modifications.      Relevant Orders   Hemoglobin A1c   Other Visit Diagnoses     Tobacco use       Relevant Orders   Urine Microscopic       Return in about 1 year (around 05/14/2024) for CPE and Labs.    Audria Nine, NP

## 2023-05-15 NOTE — Patient Instructions (Signed)
Nice to see you today If you get a dirty cut or get bitten get your tetanus vaccine updated I will be in touch with the labs once I review them Follow up with me in 1 year, sooner if you need me

## 2023-05-15 NOTE — Assessment & Plan Note (Signed)
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

## 2023-05-15 NOTE — Assessment & Plan Note (Signed)
Continue working on healthy lifestyle modifications 

## 2023-05-15 NOTE — Assessment & Plan Note (Signed)
Was on trazodone but has been off for approximately 1 to 2 months doing well without medication continue without trazodone at this juncture

## 2023-05-16 ENCOUNTER — Encounter: Payer: Self-pay | Admitting: Nurse Practitioner

## 2023-06-06 ENCOUNTER — Telehealth: Payer: Self-pay | Admitting: Nurse Practitioner

## 2023-06-06 NOTE — Telephone Encounter (Signed)
Called pt and gave him his lab results. Called patient reviewed all information and repeated back to me. Will call if any questions.

## 2023-06-06 NOTE — Telephone Encounter (Signed)
Patient returned call regarding his lab results.Would like a call back

## 2023-07-07 ENCOUNTER — Ambulatory Visit: Payer: 59 | Admitting: Nurse Practitioner

## 2023-07-23 ENCOUNTER — Ambulatory Visit: Payer: 59 | Admitting: Nurse Practitioner

## 2023-07-24 ENCOUNTER — Encounter: Payer: Self-pay | Admitting: Nurse Practitioner

## 2023-11-13 ENCOUNTER — Other Ambulatory Visit: Payer: Self-pay | Admitting: Nurse Practitioner

## 2023-12-03 ENCOUNTER — Telehealth: Payer: Self-pay | Admitting: Nurse Practitioner

## 2023-12-03 NOTE — Telephone Encounter (Signed)
I spoke with pt; no available appt at [facility] or [facility]; I offered to schedule appt at [facility] but pt said "Does not want to drive start coughing and Crash".  Pt does not have available covid test.Pt already has appt with Audria Nine NP on 12/04/23 at 11:40.pt said he has no one to drive him. ED precautions given and pt voiced understanding. Sending note to Iven Finn NP.

## 2023-12-03 NOTE — Telephone Encounter (Signed)
Agree with ED/UC procautions will evaluate tomorrow in office

## 2023-12-03 NOTE — Telephone Encounter (Signed)
FYI: This call has been transferred to Access Nurse. Once the result note has been entered staff can address the message at that time.  Patient called in with the following symptoms:  Red Word:chest pain Patient called in and stated that he is having chest pain and it feels like something is stuck in his chest. He is also experiencing a sore throat, headache, fever (100.2), sweats and cold.   Please advise at Mobile [phone] (mobile)  Message is routed to Provider Pool and [facility] Triage

## 2023-12-04 ENCOUNTER — Ambulatory Visit: Payer: 59 | Admitting: Nurse Practitioner

## 2023-12-04 VITALS — BP 108/82 | HR 90 | Temp 98.7°F | Ht 71.0 in | Wt 229.4 lb

## 2023-12-04 DIAGNOSIS — R110 Nausea: Secondary | ICD-10-CM | POA: Insufficient documentation

## 2023-12-04 DIAGNOSIS — J029 Acute pharyngitis, unspecified: Secondary | ICD-10-CM

## 2023-12-04 DIAGNOSIS — G4489 Other headache syndrome: Secondary | ICD-10-CM | POA: Insufficient documentation

## 2023-12-04 DIAGNOSIS — R509 Fever, unspecified: Secondary | ICD-10-CM | POA: Diagnosis not present

## 2023-12-04 DIAGNOSIS — R051 Acute cough: Secondary | ICD-10-CM | POA: Insufficient documentation

## 2023-12-04 DIAGNOSIS — R52 Pain, unspecified: Secondary | ICD-10-CM

## 2023-12-04 DIAGNOSIS — J101 Influenza due to other identified influenza virus with other respiratory manifestations: Secondary | ICD-10-CM | POA: Insufficient documentation

## 2023-12-04 LAB — POCT FLU A/B STATUS
Influenza A, POC: POSITIVE — AB
Influenza B, POC: NEGATIVE

## 2023-12-04 LAB — POC COVID19 BINAXNOW: SARS Coronavirus 2 Ag: NEGATIVE

## 2023-12-04 LAB — POCT RAPID STREP A (OFFICE): Rapid Strep A Screen: NEGATIVE

## 2023-12-04 MED ORDER — ONDANSETRON 4 MG PO TBDP: 4.0000 mg | ORAL_TABLET | Freq: Three times a day (TID) | ORAL | 0 refills | Status: AC | PRN

## 2023-12-04 NOTE — Assessment & Plan Note (Signed)
Flu and COVID test in office.  Patient use over-the-counter analgesics as needed

## 2023-12-04 NOTE — Assessment & Plan Note (Signed)
Flu and COVID test in office.  Patient can use over-the-counter analgesics as needed.  Push fluids

## 2023-12-04 NOTE — Progress Notes (Signed)
Acute Office Visit  Subjective:     Patient ID: Louis Pitts, male    DOB: [birth year], 32 y.o.   MRN: [id number]  Chief Complaint  Patient presents with   Sore Throat    Pt complains symptoms started 2 days ago. States that sore throat is the only thing that has gotten worse.    Headache   Cough    Slight chest pain when coughing.      Patient is in today for sick symptoms with a history of insomnia, hld, obesity, tobacco abuse, anxiety   Symptoms started on Tuesday  No covid or flu vaccines States that he [place]   Stats that he took a off brand aspirin that did help for the headache    Review of Systems  Constitutional:  Positive for chills, fever and malaise/fatigue.       Appetite decreased  Drinking plenty of fluid   HENT:  Positive for sore throat.   Respiratory:  Positive for cough and sputum production.   Cardiovascular:  Positive for chest pain.  Gastrointestinal:  Positive for nausea. Negative for abdominal pain, constipation, diarrhea and vomiting.  Musculoskeletal:  Positive for myalgias.  Neurological:  Positive for headaches.        Objective:    BP 108/82   Pulse 90   Temp 98.7 F (37.1 C) (Oral)   Ht 5\' 11"  (1.803 m)   Wt 229 lb 6.4 oz (104.1 kg)   SpO2 97%   BMI 31.99 kg/m    Physical Exam Vitals and nursing note reviewed.  Constitutional:      Appearance: Normal appearance.  HENT:     Right Ear: Ear canal and external ear normal. There is impacted cerumen.     Left Ear: Tympanic membrane, ear canal and external ear normal.     Nose:     Right Sinus: Frontal sinus tenderness present. No maxillary sinus tenderness.     Left Sinus: Frontal sinus tenderness present. No maxillary sinus tenderness.     Mouth/Throat:     Mouth: Mucous membranes are moist.     Pharynx: Oropharynx is clear.  Cardiovascular:     Rate and Rhythm: Normal rate and regular rhythm.     Heart sounds: Normal heart sounds.   Pulmonary:     Effort: Pulmonary effort is normal.     Breath sounds: Normal breath sounds.  Abdominal:     General: Bowel sounds are normal.     Tenderness: There is no abdominal tenderness.  Lymphadenopathy:     Cervical: No cervical adenopathy.  Neurological:     Mental Status: He is alert.     Results for orders placed or performed in visit on 12/04/23  Rapid Strep A  Result Value Ref Range   Rapid Strep A Screen Negative Negative  POC COVID-19  Result Value Ref Range   SARS Coronavirus 2 Ag Negative Negative  POCT Flu A & B Status  Result Value Ref Range   Influenza A, POC Positive (A) Negative   Influenza B, POC Negative Negative        Assessment & Plan:   Problem List Items Addressed This Visit       Respiratory   Influenza A - Primary   Flu a test positive in office.  Did offer and discuss Tamiflu medication patient politely declined.  Rest drink plenty of fluids and symptomatic treatment.        Other  Body aches   Flu and COVID test in office.  Over-the-counter analgesics as needed      Relevant Orders   POC COVID-19 (Completed)   POCT Flu A & B Status (Completed)   Acute cough   Flu and COVID test in office.      Relevant Orders   POC COVID-19 (Completed)   POCT Flu A & B Status (Completed)   Other headache syndrome   Flu and COVID test in office.  Patient can use over-the-counter analgesics as needed.  Push fluids      Relevant Orders   POC COVID-19 (Completed)   POCT Flu A & B Status (Completed)   Fever and chills   Flu and COVID test in office.  Patient use over-the-counter analgesics as needed      Relevant Orders   POC COVID-19 (Completed)   POCT Flu A & B Status (Completed)   Nausea   Ondansetron 4 mg 3 times daily as needed.  Diet as tolerated      Relevant Medications   ondansetron (ZOFRAN-ODT) 4 MG disintegrating tablet   Sore throat   Flu, strep, COVID test in office.  Patient can use over-the-counter analgesics as  needed and warm salt water gargles.      Relevant Orders   Rapid Strep A (Completed)   POC COVID-19 (Completed)   POCT Flu A & B Status (Completed)    Meds ordered this encounter  Medications   ondansetron (ZOFRAN-ODT) 4 MG disintegrating tablet    Sig: Take 1 tablet (4 mg total) by mouth every 8 (eight) hours as needed.    Dispense:  20 tablet    Refill:  0    Supervising Provider:   Roxy Manns A [[id number]]    Return if symptoms worsen or fail to improve.  Audria Nine, NP

## 2023-12-04 NOTE — Assessment & Plan Note (Signed)
Flu and COVID test in office.  Over-the-counter analgesics as needed

## 2023-12-04 NOTE — Patient Instructions (Signed)
Nice to see you today You have Flu Take tylenol and ibuprofen for the fevers, body aches, headache, sore throat. You can take one wait 4 hours then take the other and cycle them every 4 hours Drink plenty of fluid  You can get plain mucinex over the counter to help with the mucous that you are coughing up

## 2023-12-04 NOTE — Assessment & Plan Note (Signed)
Flu a test positive in office.  Did offer and discuss Tamiflu medication patient politely declined.  Rest drink plenty of fluids and symptomatic treatment.

## 2023-12-04 NOTE — Assessment & Plan Note (Signed)
 Flu and COVID test in office.

## 2023-12-04 NOTE — Assessment & Plan Note (Signed)
Ondansetron 4 mg 3 times daily as needed.  Diet as tolerated

## 2023-12-04 NOTE — Assessment & Plan Note (Signed)
Flu, strep, COVID test in office.  Patient can use over-the-counter analgesics as needed and warm salt water gargles.

## 2023-12-08 ENCOUNTER — Other Ambulatory Visit: Payer: Self-pay | Admitting: Nurse Practitioner

## 2024-02-14 ENCOUNTER — Other Ambulatory Visit: Payer: Self-pay | Admitting: Nurse Practitioner

## 2024-02-14 DIAGNOSIS — F419 Anxiety disorder, unspecified: Secondary | ICD-10-CM

## 2024-02-15 NOTE — Telephone Encounter (Signed)
 Can we get the patient scheduled for a physical. His last one was 05/15/2023 it will need to be 05/15/2024 or slightly after

## 2024-02-16 NOTE — Telephone Encounter (Signed)
 Called pt and schedule appt for cpe

## 2024-05-03 ENCOUNTER — Other Ambulatory Visit: Payer: Self-pay | Admitting: Nurse Practitioner

## 2024-05-03 DIAGNOSIS — F419 Anxiety disorder, unspecified: Secondary | ICD-10-CM

## 2024-05-09 ENCOUNTER — Other Ambulatory Visit: Payer: Self-pay | Admitting: Nurse Practitioner

## 2024-05-09 DIAGNOSIS — F419 Anxiety disorder, unspecified: Secondary | ICD-10-CM

## 2024-05-11 ENCOUNTER — Ambulatory Visit (INDEPENDENT_AMBULATORY_CARE_PROVIDER_SITE_OTHER): Payer: Self-pay | Admitting: Nurse Practitioner

## 2024-05-11 VITALS — BP 108/82 | HR 83 | Temp 97.6°F | Ht 71.0 in | Wt 227.6 lb

## 2024-05-11 DIAGNOSIS — F419 Anxiety disorder, unspecified: Secondary | ICD-10-CM

## 2024-05-11 DIAGNOSIS — H6123 Impacted cerumen, bilateral: Secondary | ICD-10-CM

## 2024-05-11 DIAGNOSIS — F5101 Primary insomnia: Secondary | ICD-10-CM

## 2024-05-11 LAB — COMPREHENSIVE METABOLIC PANEL WITH GFR
ALT: 16 U/L (ref 0–53)
AST: 14 U/L (ref 0–37)
Albumin: 4.9 g/dL (ref 3.5–5.2)
Alkaline Phosphatase: 40 U/L (ref 39–117)
BUN: 9 mg/dL (ref 6–23)
CO2: 25 meq/L (ref 19–32)
Calcium: 9.6 mg/dL (ref 8.4–10.5)
Chloride: 107 meq/L (ref 96–112)
Creatinine, Ser: 0.91 mg/dL (ref 0.40–1.50)
GFR: 110.31 mL/min (ref >60.00)
Glucose, Bld: 91 mg/dL (ref 70–99)
Potassium: 4.2 meq/L (ref 3.5–5.1)
Sodium: 142 meq/L (ref 135–145)
Total Bilirubin: 0.7 mg/dL (ref 0.2–1.2)
Total Protein: 7.3 g/dL (ref 6.0–8.3)

## 2024-05-11 NOTE — Patient Instructions (Signed)
 Nice to see you today I will be in touch with the labs once I have them Follow up with me in 1 year, sooner if you need me

## 2024-05-11 NOTE — Assessment & Plan Note (Signed)
 Currently maintained on gabapentin  300 mg 3 times daily and BuSpar  10 mg TID. Mood is good. He denies HI/SI/AVH

## 2024-05-11 NOTE — Assessment & Plan Note (Signed)
 Verbal consent obtained.  Patient was prepped per office policy.  A mixture of water and hydroperoxide was used.  Bilateral ears were irrigated.  Impaction was removed.  Patient tolerated procedure well.

## 2024-05-11 NOTE — Progress Notes (Signed)
 Established Patient Office Visit  Subjective   Patient ID: Louis Pitts, male    DOB: Sep 26, 1991  Age: 33 y.o. MRN: [id number]  Chief Complaint  Patient presents with   Medication Refill    Gabapentin        Anxiety:  patient is currently maintained on gabapentin  300mg  TID and buspar  10mg  TID. States that sleep is back and forth. He use to be on trazodone . States that he feels like his brain is still going without stimulation. It is worse with large events or things. He will do 300mg  in the day and the 600mg  at night.    Insomnia: he has not been on it regularly. States that he will take it once to twice a month  States that he was digging in his ear on the left side with his finger. States that a week ago he did it and the ear wax is red. He does not remember scratching himself and does not have pain and hearing is normal for him.     12/04/2023   12:13 PM 05/15/2023   12:21 PM 03/14/2022    2:23 PM  PHQ9 SCORE ONLY  PHQ-9 Total Score 4 2 0        12/04/2023   12:14 PM 05/15/2023   12:21 PM  GAD 7 : Generalized Anxiety Score  Nervous, Anxious, on Edge 1 1  Control/stop worrying 1 0  Worry too much - different things 1 1  Trouble relaxing 1 1  Restless 0 0  Easily annoyed or irritable 2 2  Afraid - awful might happen 0 0  Total GAD 7 Score 6 5  Anxiety Difficulty Somewhat difficult Somewhat difficult         Review of Systems  Constitutional:  Negative for chills and fever.  Respiratory:  Negative for shortness of breath.   Cardiovascular:  Negative for chest pain.  Neurological:  Negative for dizziness and headaches.  Psychiatric/Behavioral:  Negative for hallucinations and suicidal ideas.       Objective:     BP 108/82   Pulse 83   Temp 97.6 F (36.4 C) (Oral)   Ht 5\' 11"  (1.803 m)   Wt 227 lb 9.6 oz (103.2 kg)   SpO2 92%   BMI 31.74 kg/m  BP Readings from Last 3 Encounters:  05/11/24 108/82  12/04/23 108/82  05/15/23 100/80   Wt  Readings from Last 3 Encounters:  05/11/24 227 lb 9.6 oz (103.2 kg)  12/04/23 229 lb 6.4 oz (104.1 kg)  05/15/23 227 lb 2 oz (103 kg)   SpO2 Readings from Last 3 Encounters:  05/11/24 92%  12/04/23 97%  05/15/23 98%      Physical Exam Vitals and nursing note reviewed.  Constitutional:      Appearance: Normal appearance.  HENT:     Right Ear: Ear canal and external ear normal. There is impacted cerumen.     Left Ear: Ear canal and external ear normal. There is impacted cerumen.  Cardiovascular:     Rate and Rhythm: Normal rate and regular rhythm.     Heart sounds: Normal heart sounds.  Pulmonary:     Effort: Pulmonary effort is normal.     Breath sounds: Normal breath sounds.  Neurological:     Mental Status: He is alert.  Psychiatric:        Attention and Perception: Attention normal.        Mood and Affect: Mood normal.  Speech: Speech normal.        Behavior: Behavior normal. Behavior is cooperative.        Thought Content: Thought content normal.        Cognition and Memory: Cognition normal.      No results found for any visits on 05/11/24.    The ASCVD Risk score (Arnett DK, et al., 2019) failed to calculate for the following reasons:   The 2019 ASCVD risk score is only valid for ages 65 to 74    Assessment & Plan:   Problem List Items Addressed This Visit       Nervous and Auditory   Bilateral impacted cerumen - Primary   Verbal consent obtained.  Patient was prepped per office policy.  A mixture of water and hydroperoxide was used.  Bilateral ears were irrigated.  Impaction was removed.  Patient tolerated procedure well.      Relevant Orders   Ear Lavage     Other   Primary insomnia   Patient will use trazodone  150 mg nightly as needed.  Continue.  States he takes it approximately twice a month      Anxiety   Currently maintained on gabapentin  300 mg 3 times daily and BuSpar  10 mg TID. Mood is good. He denies HI/SI/AVH      Relevant  Orders   Comprehensive metabolic panel with GFR    Return in about 1 year (around 05/11/2025) for CPE and Labs.    Margarie Shay, NP

## 2024-05-11 NOTE — Assessment & Plan Note (Signed)
 Patient will use trazodone  150 mg nightly as needed.  Continue.  States he takes it approximately twice a month

## 2024-05-14 ENCOUNTER — Ambulatory Visit: Payer: Self-pay | Admitting: Nurse Practitioner

## 2024-05-20 ENCOUNTER — Encounter: Payer: 59 | Admitting: Nurse Practitioner

## 2024-06-14 ENCOUNTER — Encounter: Payer: Self-pay | Admitting: Nurse Practitioner

## 2024-06-14 ENCOUNTER — Telehealth: Payer: Self-pay | Admitting: Nurse Practitioner

## 2024-06-14 NOTE — Telephone Encounter (Signed)
 Called patient states he is having the same left leg pain and cramping that he has had in the past. Wanted to know what to take for it. Recommended office visit. Patient declined. Would like to try over the counter medications and give us  a call back. Advised patient if no improvement or increased pain to give us  a call. He agreed.

## 2024-06-14 NOTE — Telephone Encounter (Signed)
 Patient verbalized to know which medications to take for leg pain.

## 2024-06-15 NOTE — Telephone Encounter (Signed)
 Noted

## 2024-07-12 ENCOUNTER — Other Ambulatory Visit: Payer: Self-pay | Admitting: Nurse Practitioner

## 2024-07-12 ENCOUNTER — Encounter: Admitting: Nurse Practitioner

## 2024-07-12 DIAGNOSIS — F419 Anxiety disorder, unspecified: Secondary | ICD-10-CM

## 2024-07-14 ENCOUNTER — Telehealth: Payer: Self-pay

## 2024-07-14 NOTE — Telephone Encounter (Signed)
 Copied from CRM #[id number]. Topic: Clinical - Medication Question >> Jul 13, 2024  2:29 PM Frederich PARAS wrote: Reason for CRM: PT Calling in about the gabapenton 300MG  . He wants to know why he doesn't have a refill on it. He adv the pharmacy told him h ha no fills. According to notes it looks like no fills until a office visit is done. I Adv him of the 2 last apts were cancelled. Pt says he works a lot and its hard to get off. I tried scheduling however pcp is not avail until next yr. Pt wants t know if he can get a fill for the gabapenton 300MG 

## 2024-07-15 NOTE — Telephone Encounter (Signed)
 Called pt and relayed information.  Pt states that he is going to [facility] later this afternoon.

## 2024-07-15 NOTE — Telephone Encounter (Signed)
 Patient does not need another office visit. I sent in 90 day supply with a [facility] [place]

## 2024-08-11 ENCOUNTER — Other Ambulatory Visit: Payer: Self-pay | Admitting: Nurse Practitioner

## 2024-08-11 DIAGNOSIS — F419 Anxiety disorder, unspecified: Secondary | ICD-10-CM

## 2024-09-23 ENCOUNTER — Telehealth: Payer: Self-pay | Admitting: Nurse Practitioner

## 2024-09-23 NOTE — Telephone Encounter (Signed)
 Patient is asking to get Shampoo, and cream for skin condition [facility]  Prescription Request  09/23/2024  LOV: 05/11/2024  What is the name of the medication or equipment? Patient does not remembre  Have you contacted your pharmacy to request a refill? No   Which pharmacy would you like this sent to?  [id number], [place] - [address] [place] [address] Phone: [phone] Fax: [phone]    Patient notified that their request is being sent to the clinical staff for review and that they should receive a response within 2 business days.   Please advise at Mobile [phone] (mobile)

## 2024-09-24 ENCOUNTER — Encounter: Payer: Self-pay | Admitting: Nurse Practitioner

## 2024-09-24 ENCOUNTER — Ambulatory Visit: Payer: Self-pay | Admitting: *Deleted

## 2024-09-24 ENCOUNTER — Telehealth: Payer: Self-pay | Admitting: Nurse Practitioner

## 2024-09-24 VITALS — BP 108/82 | Ht 71.0 in | Wt 227.0 lb

## 2024-09-24 DIAGNOSIS — R21 Rash and other nonspecific skin eruption: Secondary | ICD-10-CM

## 2024-09-24 MED ORDER — KETOCONAZOLE 2 % EX CREA: 1.0000 | TOPICAL_CREAM | Freq: Two times a day (BID) | CUTANEOUS | 0 refills | Status: AC

## 2024-09-24 MED ORDER — KETOCONAZOLE 2 % EX SHAM: 1.0000 | MEDICATED_SHAMPOO | CUTANEOUS | 0 refills | Status: AC

## 2024-09-24 NOTE — Telephone Encounter (Signed)
 FYI Only or Action Required?: FYI only for provider.  Patient was last seen in primary care on 05/11/2024 by Wendee Lynwood HERO, NP.  Called Nurse Triage reporting Rash.  Symptoms began several days ago.  Symptoms are: unchanged.  Triage Disposition: See PCP When Office is Open (Within 3 Days)  Patient/caregiver understands and will follow disposition?: Yes      Copied from CRM #[id number]. Topic: Clinical - Medication Question >> Sep 24, 2024  2:42 PM Drema MATSU wrote: Reason for CRM: Patient stating that he is needing shampoo. Next available appointment for med refill is Nov 6th. He is breaking out and is need of it. He has a physical coming up on 10/27. He is requesting a callback from [name].   Attempted to contact patient regarding request for Rx and discuss symptoms. Patient did not answer- left message to call office to clarify need and symptoms.    Note from provider:Cable, Lynwood HERO, NP    09/24/24 12:06 PM Note I dont have a cream or shampoo listed on his medication list or in  his previous medications. Does not look like we have talked about this in the past year. He will need a visit.         Reason for Disposition  Mild widespread rash  (Exception: Heat rash lasting 3 days or less.)  Answer Assessment - Initial Assessment Questions Patient has similar symptoms in the past from a chronic skin disease that is usually treated with Ketoconazole 2% cream and a shampoo.     1. APPEARANCE of RASH: What does the rash look like? (e.g., blisters, dry flaky skin, red spots, redness, sores)     Red and patchy  2. SIZE: How big are the spots? (e.g., tip of pen, eraser, coin; inches, centimeters)     Varying sizes, many spots connected  3. LOCATION: Where is the rash located?     Diffuse  4. COLOR: What color is the rash? (Note: It is difficult to assess rash color in people with darker-colored skin. When this situation occurs, simply ask the caller to describe what they  see.)     Some redness  5. ONSET: When did the rash begin?     6 days ago  6. FEVER: Do you have a fever? If Yes, ask: What is your temperature, how was it measured, and when did it start?     No 7. ITCHING: Does the rash itch? If Yes, ask: How bad is the itch? (Scale 1-10; or mild, moderate, severe)     Mild to moderate   8. CAUSE: What do you think is causing the rash?     Unsure, chronic problem  9. MEDICINE FACTORS: Have you started any new medicines within the last 2 weeks? (e.g., antibiotics)      No 10. OTHER SYMPTOMS: Do you have any other symptoms? (e.g., dizziness, headache, sore throat, joint pain)       No  Protocols used: Rash or Redness - [facility] [place]

## 2024-09-24 NOTE — Telephone Encounter (Signed)
 Copied from CRM #[id number]. Topic: Clinical - Medication Question >> Sep 24, 2024  2:42 PM Drema MATSU wrote: Reason for CRM: Patient stating that he is needing shampoo. Next available appointment for med refill is Nov 6th. He is breaking out and is need of it. He has a physical coming up on 10/27. He is requesting a callback from [name].   Attempted to contact patient regarding request for Rx and discuss symptoms. Patient did not answer- left message to call office to clarify need and symptoms.    Note from provider:Cable, Lynwood HERO, NP    09/24/24 12:06 PM Note I dont have a cream or shampoo listed on his medication list or in  his previous medications. Does not look like we have talked about this in the past year. He will need a visit.

## 2024-09-24 NOTE — Telephone Encounter (Signed)
 This encounter was created in error - please disregard.

## 2024-09-24 NOTE — Telephone Encounter (Signed)
 Left voicemail for patient to call the office back.

## 2024-09-24 NOTE — Progress Notes (Signed)
 Virtual Visit via Video Note  I connected with Louis Pitts on 09/24/2024 at 4:04 PM by a video enabled telemedicine application and verified that I am speaking with the correct person using two identifiers.  Patient Location: Home Provider Location: Office/Clinic  I discussed the limitations, risks, security, and privacy concerns of performing an evaluation and management service by video and the availability of in person appointments. I also discussed with the patient that there may be a patient responsible charge related to this service. The patient expressed understanding and agreed to proceed.  Subjective: PCP: Wendee Lynwood HERO, NP  Chief Complaint  Patient presents with   Acute Visit    Rash flared up needs medication   HPI Louis Pitts is a 33 year old male presents with a rash on his back and chest.  The rash began approximately two weeks ago and is pruritic. It has increased in size since onset. He report having similar kind of rash before and was treated with Ketoconazole cream and shampoo. There have been no recent changes in medication or skincare products. He has no used any medication.  He is requesting ketoconazole cream and shampoo.  ROS: Per HPI  Current Outpatient Medications:    busPIRone  (BUSPAR ) 10 MG tablet, TAKE 1 TABLET BY MOUTH THREE TIMES A DAY, Disp: 270 tablet, Rfl: 0   gabapentin  (NEURONTIN ) 300 MG capsule, TAKE 1 CAPSULE BY MOUTH THREE TIMES A DAY., Disp: 270 capsule, Rfl: 1   ketoconazole (NIZORAL) 2 % cream, Apply 1 Application topically 2 (two) times daily., Disp: 15 g, Rfl: 0   [START ON 09/27/2024] ketoconazole (NIZORAL) 2 % shampoo, Apply 1 Application topically 2 (two) times a week., Disp: 120 mL, Rfl: 0   ondansetron  (ZOFRAN -ODT) 4 MG disintegrating tablet, Take 1 tablet (4 mg total) by mouth every 8 (eight) hours as needed., Disp: 20 tablet, Rfl: 0   traZODone  (DESYREL ) 150 MG tablet, TAKE 1 TABLET BY MOUTH EVERYDAY AT BEDTIME,  Disp: 90 tablet, Rfl: 0  Observations/Objective: Today's Vitals   09/24/24 1601  BP: 108/82  Weight: 227 lb (103 kg)  Height: 5' 11 (1.803 m)   Physical Exam Constitutional:      General: He is not in acute distress.    Appearance: Normal appearance. He is not ill-appearing.  Pulmonary:     Effort: No respiratory distress.  Neurological:     Mental Status: He is alert.  Psychiatric:        Mood and Affect: Mood normal.        Behavior: Behavior normal.        Thought Content: Thought content normal.        Judgment: Judgment normal.     Assessment and Plan: Rash Assessment & Plan: Pruritic rash on back and chest. History of previous similar rash reported by the patient and  treated with 2% ketoconazole cream and shampoo.  - Prescribed 2% ketoconazole cream and shampoo. - Advised application of cream twice daily. - Recommended in person evaluation if no improvement.   Other orders -     Ketoconazole; Apply 1 Application topically 2 (two) times daily.  Dispense: 15 g; Refill: 0 -     Ketoconazole; Apply 1 Application topically 2 (two) times a week.  Dispense: 120 mL; Refill: 0    Follow Up Instructions: Return inperson if symptoms worsen or fail to improve.   I discussed the assessment and treatment plan with the patient. The patient was provided an opportunity to ask  questions, and all were answered. The patient agreed with the plan and demonstrated an understanding of the instructions.   The patient was advised to call back or seek an in-person evaluation if the symptoms worsen or if the condition fails to improve as anticipated.  The above assessment and management plan was discussed with the patient. The patient verbalized understanding of and has agreed to the management plan.   Jacklin Zwick, NP

## 2024-09-24 NOTE — Telephone Encounter (Deleted)
 Copied from CRM #[id number]. Topic: Clinical - Medication Question >> Sep 24, 2024  2:42 PM Louis Pitts wrote: Reason for CRM: Patient stating that he is needing shampoo. Next available appointment for med refill is Nov 6th. He is breaking out and is need of it. He has a physical coming up on 10/27. He is requesting a callback from [name].

## 2024-09-24 NOTE — Telephone Encounter (Signed)
 I dont have a cream or shampoo listed on his medication list or in  his previous medications. Does not look like we have talked about this in the past year. He will need a visit.

## 2024-09-26 ENCOUNTER — Encounter: Payer: Self-pay | Admitting: Nurse Practitioner

## 2024-09-26 DIAGNOSIS — R21 Rash and other nonspecific skin eruption: Secondary | ICD-10-CM | POA: Insufficient documentation

## 2024-09-26 NOTE — Assessment & Plan Note (Signed)
 Pruritic rash on back and chest. History of previous similar rash reported by the patient and  treated with 2% ketoconazole cream and shampoo.  - Prescribed 2% ketoconazole cream and shampoo. - Advised application of cream twice daily. - Recommended in person evaluation if no improvement.

## 2024-09-28 ENCOUNTER — Telehealth: Payer: Self-pay

## 2024-09-28 NOTE — Telephone Encounter (Signed)
 Copied from CRM [id number]. Topic: General - Call Back - No Documentation >> Sep 27, 2024  3:50 PM Macario HERO wrote: Reason for CRM: Patient called said he received a call from clinic and when he answered he was told to hold but call was disconnected. Called [facility] and was told no one from the office reached out.

## 2024-09-30 NOTE — Telephone Encounter (Signed)
 Patient was seen by [facility] same day as message. No further action needed at this time.

## 2024-10-18 ENCOUNTER — Encounter: Payer: Self-pay | Admitting: Nurse Practitioner

## 2024-12-30 ENCOUNTER — Telehealth: Payer: Self-pay

## 2024-12-30 NOTE — Telephone Encounter (Signed)
 Per last addendum no script is needed at this time

## 2024-12-30 NOTE — Telephone Encounter (Signed)
 Copied from CRM #[id number]. Topic: Clinical - Medication Question >> Dec 30, 2024  9:15 AM Rea BROCKS wrote: Reason for CRM: traZODone  (DESYREL ) 150 MG tablet.   Patient is asking if he can get 4 pills called in to last him for his work trip. He is on the way to the airport  for work and forgot to pack his last few pills that he has at home.   [id number] - [place], [place] - [address] AT [facility] [address] [place] [address] Phone: [phone] Fax: [phone] Hours: Not open 24 hours   [phone] (M) >> Dec 30, 2024  9:34 AM China J wrote: Patient is calling back to let his PCP know that he is no longer needing the medication sent in. He checked his bag one last time and found his medication.  >> Dec 30, 2024  9:18 AM Rea C wrote: Patient will be on a plane from 12pm-2pm in case there's a need for a call.
